# Patient Record
Sex: Male | Born: 1965
Health system: Southern US, Community
[De-identification: ages and names within clinical notes are randomized; demographics above are authoritative.]

## PROBLEM LIST (undated history)

## (undated) DIAGNOSIS — E119 Type 2 diabetes mellitus without complications: Secondary | ICD-10-CM

## (undated) DIAGNOSIS — J189 Pneumonia, unspecified organism: Secondary | ICD-10-CM

## (undated) DIAGNOSIS — I219 Acute myocardial infarction, unspecified: Secondary | ICD-10-CM

## (undated) DIAGNOSIS — E785 Hyperlipidemia, unspecified: Secondary | ICD-10-CM

## (undated) DIAGNOSIS — I2510 Atherosclerotic heart disease of native coronary artery without angina pectoris: Secondary | ICD-10-CM

## (undated) DIAGNOSIS — I10 Essential (primary) hypertension: Secondary | ICD-10-CM

## (undated) HISTORY — PX: FRACTURE SURGERY: SHX138

## (undated) HISTORY — DX: Atherosclerotic heart disease of native coronary artery without angina pectoris: I25.10

## (undated) HISTORY — DX: Essential (primary) hypertension: I10

## (undated) HISTORY — DX: Type 2 diabetes mellitus without complications: E11.9

## (undated) HISTORY — PX: CORONARY ANGIOPLASTY WITH STENT PLACEMENT: SHX49

## (undated) HISTORY — PX: APPENDECTOMY: SHX54

## (undated) HISTORY — PX: FINGER FRACTURE SURGERY: SHX638

## (undated) HISTORY — DX: Hyperlipidemia, unspecified: E78.5

---

## 1995-01-05 HISTORY — PX: WRIST FRACTURE SURGERY: SHX121

## 1995-01-05 HISTORY — PX: FOREARM FRACTURE SURGERY: SHX649

## 2006-01-05 ENCOUNTER — Ambulatory Visit: Payer: Self-pay | Admitting: Family Medicine

## 2007-05-17 ENCOUNTER — Encounter (INDEPENDENT_AMBULATORY_CARE_PROVIDER_SITE_OTHER): Payer: Self-pay | Admitting: *Deleted

## 2007-05-17 ENCOUNTER — Encounter: Admission: RE | Admit: 2007-05-17 | Discharge: 2007-05-17 | Payer: Self-pay | Admitting: Gastroenterology

## 2007-05-17 ENCOUNTER — Inpatient Hospital Stay (HOSPITAL_COMMUNITY): Admission: EM | Admit: 2007-05-17 | Discharge: 2007-05-20 | Payer: Self-pay | Admitting: Emergency Medicine

## 2008-12-29 ENCOUNTER — Ambulatory Visit: Payer: Self-pay | Admitting: Cardiology

## 2008-12-29 ENCOUNTER — Inpatient Hospital Stay (HOSPITAL_COMMUNITY): Admission: EM | Admit: 2008-12-29 | Discharge: 2009-01-01 | Payer: Self-pay | Admitting: Emergency Medicine

## 2008-12-29 DIAGNOSIS — I219 Acute myocardial infarction, unspecified: Secondary | ICD-10-CM

## 2008-12-29 HISTORY — DX: Acute myocardial infarction, unspecified: I21.9

## 2008-12-30 ENCOUNTER — Encounter: Payer: Self-pay | Admitting: Cardiology

## 2009-01-01 ENCOUNTER — Telehealth (INDEPENDENT_AMBULATORY_CARE_PROVIDER_SITE_OTHER): Payer: Self-pay | Admitting: *Deleted

## 2009-01-04 HISTORY — PX: CARDIAC CATHETERIZATION: SHX172

## 2009-01-10 ENCOUNTER — Encounter: Payer: Self-pay | Admitting: Cardiology

## 2009-01-22 DIAGNOSIS — I25118 Atherosclerotic heart disease of native coronary artery with other forms of angina pectoris: Secondary | ICD-10-CM | POA: Insufficient documentation

## 2009-01-22 DIAGNOSIS — E785 Hyperlipidemia, unspecified: Secondary | ICD-10-CM | POA: Insufficient documentation

## 2009-01-22 DIAGNOSIS — I2510 Atherosclerotic heart disease of native coronary artery without angina pectoris: Secondary | ICD-10-CM | POA: Insufficient documentation

## 2009-01-22 DIAGNOSIS — I498 Other specified cardiac arrhythmias: Secondary | ICD-10-CM | POA: Insufficient documentation

## 2009-01-22 DIAGNOSIS — I959 Hypotension, unspecified: Secondary | ICD-10-CM | POA: Insufficient documentation

## 2009-01-23 ENCOUNTER — Encounter: Payer: Self-pay | Admitting: Cardiology

## 2009-01-23 DIAGNOSIS — F17200 Nicotine dependence, unspecified, uncomplicated: Secondary | ICD-10-CM | POA: Insufficient documentation

## 2009-01-24 ENCOUNTER — Ambulatory Visit: Payer: Self-pay | Admitting: Cardiology

## 2009-04-30 ENCOUNTER — Ambulatory Visit: Payer: Self-pay | Admitting: Cardiology

## 2009-05-09 ENCOUNTER — Ambulatory Visit: Payer: Self-pay | Admitting: Cardiology

## 2009-05-23 ENCOUNTER — Telehealth: Payer: Self-pay | Admitting: Cardiology

## 2009-05-23 LAB — CONVERTED CEMR LAB
ALT: 54 units/L — ABNORMAL HIGH (ref 0–53)
AST: 35 units/L (ref 0–37)
Albumin: 3.9 g/dL (ref 3.5–5.2)
Alkaline Phosphatase: 85 units/L (ref 39–117)
Bilirubin, Direct: 0.1 mg/dL (ref 0.0–0.3)
Cholesterol: 140 mg/dL (ref 0–200)
HDL: 32.9 mg/dL — ABNORMAL LOW (ref >39.00)
LDL Cholesterol: 68 mg/dL (ref 0–99)
Total Bilirubin: 0.5 mg/dL (ref 0.3–1.2)
Total CHOL/HDL Ratio: 4
Total Protein: 7 g/dL (ref 6.0–8.3)
Triglycerides: 195 mg/dL — ABNORMAL HIGH (ref 0.0–149.0)
VLDL: 39 mg/dL (ref 0.0–40.0)

## 2009-07-11 ENCOUNTER — Emergency Department (HOSPITAL_COMMUNITY): Admission: EM | Admit: 2009-07-11 | Discharge: 2009-07-12 | Payer: Self-pay | Admitting: Emergency Medicine

## 2009-07-21 ENCOUNTER — Encounter: Payer: Self-pay | Admitting: Cardiology

## 2009-07-29 ENCOUNTER — Ambulatory Visit: Payer: Self-pay | Admitting: Cardiology

## 2009-08-26 ENCOUNTER — Telehealth: Payer: Self-pay | Admitting: Cardiology

## 2009-08-29 LAB — CONVERTED CEMR LAB
ALT: 53 units/L (ref 0–53)
AST: 37 units/L (ref 0–37)
Albumin: 3.9 g/dL (ref 3.5–5.2)
Alkaline Phosphatase: 83 units/L (ref 39–117)
Bilirubin, Direct: 0.1 mg/dL (ref 0.0–0.3)
Cholesterol: 119 mg/dL (ref 0–200)
Direct LDL: 66.7 mg/dL
HDL: 27.1 mg/dL — ABNORMAL LOW (ref >39.00)
Total Bilirubin: 0.8 mg/dL (ref 0.3–1.2)
Total CHOL/HDL Ratio: 4
Total Protein: 6.8 g/dL (ref 6.0–8.3)
Triglycerides: 211 mg/dL — ABNORMAL HIGH (ref 0.0–149.0)
VLDL: 42.2 mg/dL — ABNORMAL HIGH (ref 0.0–40.0)

## 2009-10-15 ENCOUNTER — Ambulatory Visit: Payer: Self-pay | Admitting: Cardiology

## 2009-10-21 LAB — CONVERTED CEMR LAB
BUN: 15 mg/dL (ref 6–23)
Basophils Absolute: 0.1 10*3/uL (ref 0.0–0.1)
Basophils Relative: 1 % (ref 0.0–3.0)
CO2: 30 meq/L (ref 19–32)
Calcium: 9.1 mg/dL (ref 8.4–10.5)
Chloride: 107 meq/L (ref 96–112)
Cholesterol: 193 mg/dL (ref 0–200)
Creatinine, Ser: 0.8 mg/dL (ref 0.4–1.5)
Eosinophils Absolute: 0.3 10*3/uL (ref 0.0–0.7)
Eosinophils Relative: 5.5 % — ABNORMAL HIGH (ref 0.0–5.0)
GFR calc non Af Amer: 106.27 mL/min (ref >60)
Glucose, Bld: 121 mg/dL — ABNORMAL HIGH (ref 70–99)
HCT: 42.7 % (ref 39.0–52.0)
HDL: 30.6 mg/dL — ABNORMAL LOW (ref >39.00)
Hemoglobin: 14.5 g/dL (ref 13.0–17.0)
LDL Cholesterol: 124 mg/dL — ABNORMAL HIGH (ref 0–99)
Lymphocytes Relative: 35.6 % (ref 12.0–46.0)
Lymphs Abs: 1.8 10*3/uL (ref 0.7–4.0)
MCHC: 34 g/dL (ref 30.0–36.0)
MCV: 87.8 fL (ref 78.0–100.0)
Monocytes Absolute: 0.4 10*3/uL (ref 0.1–1.0)
Monocytes Relative: 7 % (ref 3.0–12.0)
Neutro Abs: 2.6 10*3/uL (ref 1.4–7.7)
Neutrophils Relative %: 50.9 % (ref 43.0–77.0)
Platelets: 244 10*3/uL (ref 150.0–400.0)
Potassium: 4.3 meq/L (ref 3.5–5.1)
RBC: 4.86 M/uL (ref 4.22–5.81)
RDW: 14 % (ref 11.5–14.6)
Sodium: 140 meq/L (ref 135–145)
Total CHOL/HDL Ratio: 6
Triglycerides: 191 mg/dL — ABNORMAL HIGH (ref 0.0–149.0)
VLDL: 38.2 mg/dL (ref 0.0–40.0)
WBC: 5.1 10*3/uL (ref 4.5–10.5)

## 2009-12-25 ENCOUNTER — Telehealth: Payer: Self-pay | Admitting: Cardiology

## 2010-01-08 ENCOUNTER — Observation Stay (HOSPITAL_COMMUNITY)
Admission: EM | Admit: 2010-01-08 | Discharge: 2010-01-09 | Payer: Self-pay | Source: Home / Self Care | Attending: Cardiology | Admitting: Cardiology

## 2010-01-08 ENCOUNTER — Telehealth: Payer: Self-pay | Admitting: Cardiology

## 2010-01-08 LAB — POCT I-STAT, CHEM 8
BUN: 16 mg/dL (ref 6–23)
Calcium, Ion: 1.13 mmol/L (ref 1.12–1.32)
Chloride: 106 mEq/L (ref 96–112)
Creatinine, Ser: 0.9 mg/dL (ref 0.4–1.5)
Glucose, Bld: 110 mg/dL — ABNORMAL HIGH (ref 70–99)
HCT: 44 % (ref 39.0–52.0)
Hemoglobin: 15 g/dL (ref 13.0–17.0)
Potassium: 3.8 mEq/L (ref 3.5–5.1)
Sodium: 141 mEq/L (ref 135–145)
TCO2: 28 mmol/L (ref 0–100)

## 2010-01-08 LAB — COMPREHENSIVE METABOLIC PANEL
ALT: 37 U/L (ref 0–53)
AST: 25 U/L (ref 0–37)
Albumin: 4.1 g/dL (ref 3.5–5.2)
Alkaline Phosphatase: 83 U/L (ref 39–117)
BUN: 12 mg/dL (ref 6–23)
CO2: 25 mEq/L (ref 19–32)
Calcium: 9.3 mg/dL (ref 8.4–10.5)
Chloride: 107 mEq/L (ref 96–112)
Creatinine, Ser: 0.88 mg/dL (ref 0.4–1.5)
GFR calc Af Amer: >60 mL/min (ref >60)
GFR calc non Af Amer: >60 mL/min (ref >60)
Glucose, Bld: 104 mg/dL — ABNORMAL HIGH (ref 70–99)
Potassium: 3.7 mEq/L (ref 3.5–5.1)
Sodium: 141 mEq/L (ref 135–145)
Total Bilirubin: 0.5 mg/dL (ref 0.3–1.2)
Total Protein: 7.5 g/dL (ref 6.0–8.3)

## 2010-01-08 LAB — POCT CARDIAC MARKERS
CKMB, poc: 1.4 ng/mL (ref 1.0–8.0)
Myoglobin, poc: 70.2 ng/mL (ref 12–200)
Troponin i, poc: <0.05 ng/mL (ref 0.00–0.09)

## 2010-01-08 LAB — DIFFERENTIAL
Basophils Absolute: 0.1 10*3/uL (ref 0.0–0.1)
Basophils Relative: 1 % (ref 0–1)
Eosinophils Absolute: 0.5 10*3/uL (ref 0.0–0.7)
Eosinophils Relative: 7 % — ABNORMAL HIGH (ref 0–5)
Lymphocytes Relative: 38 % (ref 12–46)
Lymphs Abs: 2.4 10*3/uL (ref 0.7–4.0)
Monocytes Absolute: 0.4 10*3/uL (ref 0.1–1.0)
Monocytes Relative: 6 % (ref 3–12)
Neutro Abs: 3.1 10*3/uL (ref 1.7–7.7)
Neutrophils Relative %: 49 % (ref 43–77)

## 2010-01-08 LAB — CBC
HCT: 43.4 % (ref 39.0–52.0)
Hemoglobin: 14.5 g/dL (ref 13.0–17.0)
MCH: 29.2 pg (ref 26.0–34.0)
MCHC: 33.4 g/dL (ref 30.0–36.0)
MCV: 87.3 fL (ref 78.0–100.0)
Platelets: 225 10*3/uL (ref 150–400)
RBC: 4.97 MIL/uL (ref 4.22–5.81)
RDW: 14.8 % (ref 11.5–15.5)
WBC: 6.3 10*3/uL (ref 4.0–10.5)

## 2010-01-08 LAB — PROTIME-INR
INR: 0.9 (ref 0.00–1.49)
Prothrombin Time: 12.4 seconds (ref 11.6–15.2)

## 2010-01-08 LAB — CK TOTAL AND CKMB (NOT AT ARMC)
CK, MB: 2.2 ng/mL (ref 0.3–4.0)
Relative Index: INVALID (ref 0.0–2.5)
Total CK: 88 U/L (ref 7–232)

## 2010-01-08 LAB — TROPONIN I: Troponin I: 0.02 ng/mL (ref 0.00–0.06)

## 2010-01-08 LAB — HEMOGLOBIN A1C
Hgb A1c MFr Bld: 6.2 % — ABNORMAL HIGH (ref <5.7)
Mean Plasma Glucose: 131 mg/dL — ABNORMAL HIGH (ref <117)

## 2010-01-08 LAB — CARDIAC PANEL(CRET KIN+CKTOT+MB+TROPI)
CK, MB: 1.9 ng/mL (ref 0.3–4.0)
Relative Index: INVALID (ref 0.0–2.5)
Total CK: 73 U/L (ref 7–232)
Troponin I: 0.02 ng/mL (ref 0.00–0.06)

## 2010-01-08 LAB — MAGNESIUM: Magnesium: 2.1 mg/dL (ref 1.5–2.5)

## 2010-01-08 LAB — APTT: aPTT: 29 seconds (ref 24–37)

## 2010-01-09 LAB — CBC
HCT: 43.2 % (ref 39.0–52.0)
Hemoglobin: 14.3 g/dL (ref 13.0–17.0)
MCH: 29.1 pg (ref 26.0–34.0)
MCHC: 33.1 g/dL (ref 30.0–36.0)
MCV: 88 fL (ref 78.0–100.0)
Platelets: 220 10*3/uL (ref 150–400)
RBC: 4.91 MIL/uL (ref 4.22–5.81)
RDW: 14.9 % (ref 11.5–15.5)
WBC: 6.5 10*3/uL (ref 4.0–10.5)

## 2010-01-09 LAB — LIPID PANEL
Cholesterol: 172 mg/dL (ref 0–200)
HDL: 28 mg/dL — ABNORMAL LOW (ref >39)
LDL Cholesterol: 105 mg/dL — ABNORMAL HIGH (ref 0–99)
Total CHOL/HDL Ratio: 6.1 RATIO
Triglycerides: 196 mg/dL — ABNORMAL HIGH (ref <150)
VLDL: 39 mg/dL (ref 0–40)

## 2010-01-09 LAB — CARDIAC PANEL(CRET KIN+CKTOT+MB+TROPI)
CK, MB: 1.7 ng/mL (ref 0.3–4.0)
Relative Index: INVALID (ref 0.0–2.5)
Total CK: 77 U/L (ref 7–232)
Troponin I: 0.02 ng/mL (ref 0.00–0.06)

## 2010-01-09 LAB — BASIC METABOLIC PANEL
BUN: 12 mg/dL (ref 6–23)
CO2: 24 mEq/L (ref 19–32)
Calcium: 9.3 mg/dL (ref 8.4–10.5)
Chloride: 108 mEq/L (ref 96–112)
Creatinine, Ser: 0.89 mg/dL (ref 0.4–1.5)
GFR calc Af Amer: >60 mL/min (ref >60)
GFR calc non Af Amer: >60 mL/min (ref >60)
Glucose, Bld: 99 mg/dL (ref 70–99)
Potassium: 4 mEq/L (ref 3.5–5.1)
Sodium: 143 mEq/L (ref 135–145)

## 2010-01-09 LAB — HEPARIN LEVEL (UNFRACTIONATED)
Heparin Unfractionated: <0.1 IU/mL — ABNORMAL LOW (ref 0.30–0.70)
Heparin Unfractionated: <0.1 IU/mL — ABNORMAL LOW (ref 0.30–0.70)

## 2010-01-19 LAB — HEPARIN LEVEL (UNFRACTIONATED): Heparin Unfractionated: 0.19 IU/mL — ABNORMAL LOW (ref 0.30–0.70)

## 2010-01-19 LAB — D-DIMER, QUANTITATIVE: D-Dimer, Quant: 0.29 ug/mL-FEU (ref 0.00–0.48)

## 2010-01-23 ENCOUNTER — Encounter: Payer: Self-pay | Admitting: Physician Assistant

## 2010-01-23 NOTE — H&P (Signed)
NAME:  Dennis Terry, Dennis Terry NO.:  [id number]  MEDICAL RECORD NO.:  [id number]          PATIENT TYPE:  INP  LOCATION:  2032                         FACILITY:  [facility]  PHYSICIAN:  Khyrie Masi. Elidia Bonenfant, MD, FACCDATE OF BIRTH:  [birth year]  DATE OF ADMISSION:  01/08/2010 DATE OF DISCHARGE:                             HISTORY & PHYSICAL   PRIMARY CARE PHYSICIAN:  Delaney Meigs, MD  PRIMARY CARDIOLOGIST:  Luis Abed, MD, [facility]  CHIEF COMPLAINT:  Chest pain.  HISTORY OF PRESENT ILLNESS:  Mr. Salemi is a 45 year old male with a history of coronary artery disease.  He has noted a several-month history of episodic left-sided chest pain.  It is not exertional.  He describes it as both aching and throbbing.  There is no radiation and no associated symptoms.  It is not positional and does not change with deep inspiration.  There has been a crescendo pattern.  The intensity of the symptoms has increased to 5/10.  The frequency has increased to the point that he is having 3-4 episodes a day.  He came to the emergency today because of the increased frequency.  It reminds him of his MI symptoms except that chest pain was substernal and this is left sided, but the patient and his family and friends believe that his chest pain is cardiac in origin.  PAST MEDICAL HISTORY: 1. Status post non-ST-segment elevation MI in December 2010 with     recurrent chest pain, cardiac catheterization showing RCA totalled     and treated with a 3.0 x 18 mm Xience stent, LAD and circumflex     showing luminal irregularities. 2. Preserved left ventricular function with an EF of 50-55% at cath. 3. Hyperlipidemia, HDL 31 and LDL 124 in October 2011. 4. Family history of coronary artery disease. 5. Remote history of tobacco use.  PAST SURGICAL HISTORY:  He is status post open appendectomy, cardiac catheterization, and left arm surgery.  ALLERGIES:  He had hypotension with NITROGLYCERIN SUBLINGUAL,  he is allergic to [facility] and did not tolerate CRESTOR with various side effects.  CURRENT MEDICATIONS: 1. Aspirin 325 mg daily. 2. Effient 10 mg daily. 3. Livalo, dose unclear. 4. Red yeast rice 2 tablets daily. 5. Fish oil, the patient states, 1400 mg daily for 5 days.  SOCIAL HISTORY:  He lives in [place] alone.  His girlfriend and family members live nearby.  He works in [employer], a Clinical cytogeneticist.  He quit tobacco in December 2010 with approximately 30-pack-year history and denies alcohol or drug abuse.  FAMILY HISTORY:  His mother died at 9 with pancreatic cancer, no heart disease.  His father died of 17 with an MI but no siblings have coronary artery disease.  REVIEW OF SYSTEMS:  He has occasional arthralgias and joint pains. Chest pain is described above.  He has not had any recent illnesses, fevers, or chills and does not regularly experience reflux symptoms.  He has not had melena.  Full 14-point review of systems is, otherwise, negative except as stated in the HPI.  PHYSICAL EXAMINATION:  VITAL SIGNS:  Temperature is 98.0, blood pressure  135/65, pulse 86, respiratory rate 20, O2 saturation 97% on room air. GENERAL:  He is a well-developed, well-nourished, white male in no acute distress. HEENT:  Normal. NECK:  There is no lymphadenopathy, thyromegaly, bruit, or JVD noted. CARDIOVASCULAR:  His heart is regular rate and rhythm with an S1 and S2 and no significant murmur, rub, or gallop is noted.  Distal pulses are intact in all four extremities. LUNGS:  Clear to auscultation bilaterally. SKIN:  No rashes or lesions are noted. ABDOMEN:  Soft and nontender with active bowel sounds. EXTREMITIES:  There is no cyanosis, clubbing, or edema noted. MUSCULOSKELETAL:  There is no joint deformity or effusions.  No spine or CVA tenderness. NEUROLOGIC:  He is alert and oriented.  Cranial nerves II through XII grossly intact.  Chest x-ray shows no acute process, chronic bronchitic  markings. EKG showed sinus rhythm, rate 76, with some minor changes including slightly different axis, but there are no acute ischemic changes.  LABORATORY VALUES:  Hemoglobin 14.5, hematocrit 43.4, WBCs 6.3, platelets 225.  Sodium 141, potassium 3.8, chloride 106, BUN 16, creatinine 0.9, glucose 110.  Point-of-care markers negative x1.  IMPRESSION:  Mr. Mccravy was seen today by Dr. Daleen Squibb, the patient was evaluated and the data reviewed.  He is a difficult historian.  His chest pain has some features of angina but is mostly atypical.  Because of his history of significant coronary artery disease, diagnostic cath is the best option.  The options, including the risks and benefits, were discussed with the patient and his family, and they agreed to proceed. It may be done late tomorrow as the schedule is very heavy, and the patient and his family were made aware of this.  We will screen for cardiac risk factors and continue his home medications.     Theodore Demark, PA-C   ______________________________ Jesse Sans Daleen Squibb, MD, [facility]    RB/MEDQ  D:  01/08/2010  T:  01/09/2010  Job:  [id number]  Electronically Signed by Theodore Demark PA-C on 01/23/2010 06:47:33 AM Electronically Signed by Valera Castle MD [facility] on 01/23/2010 04:16:06 PM

## 2010-01-26 ENCOUNTER — Ambulatory Visit
Admission: RE | Admit: 2010-01-26 | Discharge: 2010-01-26 | Payer: Self-pay | Source: Home / Self Care | Attending: Physician Assistant | Admitting: Physician Assistant

## 2010-01-29 NOTE — Discharge Summary (Addendum)
NAME:  Dennis Terry, Dennis Terry                ACCOUNT NO.:  [id number]  MEDICAL RECORD NO.:  [id number]           PATIENT TYPE:  LOCATION:                                 FACILITY:  PHYSICIAN:  Bevelyn Buckles. Bensimhon, MDDATE OF BIRTH:  January 29, 1965  DATE OF ADMISSION: DATE OF DISCHARGE:                              DISCHARGE SUMMARY   DATE OF ADMISSION:  January 08, 2010.  DATE OF DISCHARGE:  January 09, 2010.  PRIMARY CARDIOLOGIST:  Luis Abed, MD, [facility].  PRIMARY CARE Charlise Giovanetti:  Delaney Meigs, M.D.  DISCHARGE DIAGNOSIS:  Chest pain without objective evidence of ischemia.  SECONDARY DIAGNOSES: 1. Coronary artery disease status post prior non-ST-segment elevation     myocardial infarction, December 2010, with drug-eluting stent     placement of the right coronary artery. 2. Hyperlipidemia. 3. Remote tobacco abuse.  ALLERGIES:  PERCOCET and he did not tolerate Crestor.  He has a history of hypotension with sublingual nitroglycerin.  PROCEDURES:  Left heart cardiac catheterization performed on January 09, 2010, showing 30% in-stent restenosis within the previously placed proximal RCA stent and overall nonobstructive disease with an EF of 65%.  HISTORY OF PRESENT ILLNESS:  This is a 45 year old male with prior history of CAD status post non-ST-elevation MI in December 2010 with drug-eluting stent placement of the proximal RCA.  The patient was in his usual state of health until several months ago when he began to experience episodic left-sided chest discomfort generally and occurring at rest without associated symptoms, occurring as much as 3 or 4 times a day.  Due to increasing frequency of symptoms, patient presented to [facility] ED on January 5 where ECG showed no acute changes, and enzymes were initially negative.  He was admitted for further evaluation.  HOSPITAL COURSE:  The patient ruled out for MI.  Decision was made to pursue cardiac catheterization given progression  of symptoms. Diagnostic catheterization earlier today showed nonobstructive disease with relative patency of his previously placed RCA stent.  EF was normal at 65%.  Followup D-dimer was also negative.  We have initiated PPI therapy, and patient will be discharged home this evening in good condition.  DISCHARGE LABS:  Hemoglobin 14.3, hematocrit 43.2, WBCs 6.5, and platelets 220.  Sodium 143, potassium 4.0, chloride 108, CO2 of 24, BUN 12, creatinine 0.89, glucose 99, total bilirubin 0.5, alkaline phosphatase 83, AST 25, ALT 37, total protein 7.5, albumin 4.1, calcium 9.2, and magnesium 2.1.  Hemoglobin A1c 6.2.  CK 77, MB 1.7, troponin-I 0.02.  Total cholesterol 172, triglycerides 196, HDL 28, and LDL 105.  DISPOSITION:  The patient will be discharged home today in good condition.  FOLLOWUP APPOINTMENTS: 1. We will arrange for followup with Dr. Myrtis Ser in approximately 2     weeks. 2. Follow up with Dr. Lysbeth Galas as scheduled.  DISCHARGE MEDICATIONS: 1. Protonix 40 mg daily. 2. Aspirin 325 mg daily. 3. Effient 10 mg daily. 4. Fish oil 1 capsule daily. 5. Livalo 4 mg half a tablet daily. 6. Nitroglycerin 0.4 mg sublingual p.r.n. chest pain. 7. Red yeast rice 2 tablets at bedtime.  OUTSTANDING LABORATORY  STUDIES:  None.  Duration of discharge encounter, 35 minutes including physician time.     Nicolasa Ducking, ANP   ______________________________ Bevelyn Buckles. Bensimhon, MD    CB/MEDQ  D:  01/09/2010  T:  01/10/2010  Job:  [id number]  cc:   Delaney Meigs, M.D.  Electronically Signed by Nicolasa Ducking ANP on 01/26/2010 03:46:20 PM Electronically Signed by Arvilla Meres MD on 01/29/2010 04:02:50 PM

## 2010-02-03 NOTE — Letter (Signed)
Summary: Lipid reminder  [facility], Main Office  [address]   [place], [place] [address]   Phone: [phone]  Fax: [phone]        July 21, 2009 MRN: [id number]    Dennis Terry [address] [place], [place]  [address]    Dear Dennis Terry,  Our records indicate it is time to check your cholesterol, due to your recent medication change.  Please call our office to schedule an appt for labwork.  Please remember it is a fasting lab.        Sincerely,    Meredith Staggers, RN Willa Rough, MD  This letter has been electronically signed by your physician.

## 2010-02-03 NOTE — Assessment & Plan Note (Signed)
Summary: 6 month rov/sl   Visit Type:  Follow-up Primary Provider:  Vernon Prey, MD  CC:  CAD.  History of Present Illness: The patient is seen for followup of coronary artery disease.  He is doing very well.  I saw him last April, 2011.  He had an occluded right coronary artery that was treated with drug-eluting stent December 29, 2008.  He stopped smoking the day and has not restarted.  He is not having any chest pain.  He is very active.  We have made some adjustment in his cholesterol meds.  His LDL is nicely controlled.  HDL is low and triglycerides elevated and 500 mg of slow niacin was started.  He will have followup labs today.  Current Medications (verified): 1)  Aspirin Ec 325 Mg Tbec (Aspirin) .... Take One Tablet By Mouth Daily 2)  Crestor 20 Mg Tabs (Rosuvastatin Calcium) .... Take One Tablet By Mouth Daily. 3)  Effient 10 Mg Tabs (Prasugrel Hcl) .... Take One Tablet By Mouth Daily 4)  Nitrostat 0.4 Mg Subl (Nitroglycerin) .Marland Kitchen.. 1 Tablet Under Tongue At Onset of Chest Pain; You May Repeat Every 5 Minutes For Up To 3 Doses. 5)  Slo-Niacin 500 Mg Cr-Tabs (Niacin) .... Once Daily At Bedtime  Allergies (verified): 1)  ! Percocet  Past History:  Past Medical History: BRADYCARDIA (ICD-427.89) HYPOTENSION, UNSPECIFIED (ICD-458.9) HYPERLIPIDEMIA-MIXED (ICD-272.4) Acute coronary syndrome...12/29/2008 CAD, NATIVE VESSEL (ICD-414.01)...cath...12/29/2008...occluded RCA...successful DES.Marland KitchenMarland KitchenNahser EF 55%...echo...12/30/2008...mod/severe hypo  base/mid inferior wall Smoking Hx...  Stopped... December, 2010    Review of Systems       Patient denied fever, chills, headache, sweats, rash, change in vision, change in hearing, chest pain, cough, nausea vomiting, urinary symptoms.  All other systems are reviewed and are negative.  Vital Signs:  Patient profile:   45 year old male Height:      69 inches Weight:      225 pounds BMI:     33.35 Pulse rate:   84 / minute BP sitting:   122  / 86  (left arm) Cuff size:   regular  Vitals Entered By: Hardin Negus, RMA (October 15, 2009 9:40 AM)  Physical Exam  General:  patient looks great. Eyes:  no xanthelasma. Neck:  no jugular venous distention. Lungs:  lungs are clear cardiorespiratory effort is not labored. Heart:  cardiac exam reveals S1-S2.  No clicks or significant murmurs. Abdomen:  abdomen is soft. Extremities:  no peripheral edema. Psych:  patient is oriented to person time and place.  Affect is normal.   Impression & Recommendations:  Problem # 1:  TOBACCO ABUSE (ICD-305.1) The patient is no longer smoking.  Problem # 2:  HYPOTENSION, UNSPECIFIED (ICD-458.9) Blood pressure is stable.  No change in therapy.  Problem # 3:  CAD, NATIVE VESSEL (ICD-414.01)  His updated medication list for this problem includes:    Aspirin Ec 325 Mg Tbec (Aspirin) .Marland Kitchen... Take one tablet by mouth daily    Effient 10 Mg Tabs (Prasugrel hcl) .Marland Kitchen... Take one tablet by mouth daily    Nitrostat 0.4 Mg Subl (Nitroglycerin) .Marland Kitchen... 1 tablet under tongue at onset of chest pain; you may repeat every 5 minutes for up to 3 doses. Coronary disease is stable.  No change in therapy.  Orders: TLB-BMP (Basic Metabolic Panel-BMET) (80048-METABOL) TLB-CBC Platelet - w/Differential (85025-CBCD) TLB-Lipid Panel (80061-LIPID)  Problem # 4:  HYPERLIPIDEMIA-MIXED (ICD-272.4)  His updated medication list for this problem includes:    Crestor 20 Mg Tabs (Rosuvastatin calcium) .Marland Kitchen... Take one  tablet by mouth daily.    Slo-niacin 500 Mg Cr-tabs (Niacin) ..... Once daily at bedtime Fasting lipids will be done today as he has not eaten since last night.  We will then decide about the next dose of his slow niacin.  I see him back in 6 months for cardiology followup.  Orders: TLB-BMP (Basic Metabolic Panel-BMET) (80048-METABOL) TLB-CBC Platelet - w/Differential (85025-CBCD) TLB-Lipid Panel (80061-LIPID)  Patient Instructions: 1)  Labs today 2)   Your physician wants you to follow-up in:  6 months.  You will receive a reminder letter in the mail two months in advance. If you don't receive a letter, please call our office to schedule the follow-up appointment.

## 2010-02-03 NOTE — Letter (Signed)
Summary: Return To Work  [employer], Main Office  [address]   [place], [place] [address]   Phone: [phone]  Fax: [phone]    01/10/2009  TO: WHOM IT MAY CONCERN   RE: Dennis Terry [address] [id number]   The above named individual is under my medical care and is to remain out of work until seen for follow up appointment on Friday January 24, 2009.  If you have any further questions or need additional information, please call.     Sincerely,    Meredith Staggers, RN Willa Rough, MD

## 2010-02-03 NOTE — Letter (Signed)
Summary: Return To Work  [employer], Main Office  [address]   [place], [place] [address]   Phone: [phone]  Fax: [phone]    01/24/2009  TO: Leodis Sias IT MAY CONCERN   RE: Dennis Terry [address] [id number]   The above named individual is under my medical care and may return to work on: Sunday January 26, 2009  If you have any further questions or need additional information, please call.     Sincerely,    Zackery Barefoot, MD

## 2010-02-03 NOTE — Progress Notes (Signed)
  Walk in Patient Form Recieved "Patient left FMLA papers forwarded to [facility] for processing. Cala Bradford Mesiemore  January 01, 2009 2:49 PM    Appended Document:  Recieved Request for Records from [employer] forwarded to [employer] for processing.   Appended Document:  Recieved a 2nd Request for Records from [employer] forwarded to [facility]

## 2010-02-03 NOTE — Miscellaneous (Signed)
  Clinical Lists Changes  Problems: Added new problem of TOBACCO ABUSE (ICD-305.1) Observations: Added new observation of PAST MED HX: BRADYCARDIA (ICD-427.89) HYPOTENSION, UNSPECIFIED (ICD-458.9) HYPERLIPIDEMIA-MIXED (ICD-272.4) Acute coronary syndrome...12/29/2008 CAD, NATIVE VESSEL (ICD-414.01)...cath...12/29/2009...occluded RCA...successful DES.Marland KitchenMarland KitchenNahser EF 55%...echo...12/30/2008...mod/severe hypo  base/mid inferior wall Smoking Hx   (01/23/2009 16:20)       Past History:  Past Medical History: BRADYCARDIA (ICD-427.89) HYPOTENSION, UNSPECIFIED (ICD-458.9) HYPERLIPIDEMIA-MIXED (ICD-272.4) Acute coronary syndrome...12/29/2008 CAD, NATIVE VESSEL (ICD-414.01)...cath...12/29/2009...occluded RCA...successful DES.Marland KitchenMarland KitchenNahser EF 55%...echo...12/30/2008...mod/severe hypo  base/mid inferior wall Smoking Hx

## 2010-02-03 NOTE — Progress Notes (Signed)
Summary: test results  Phone Note Call from Patient Call back at Home Phone [phone]   Caller: Patient Reason for Call: Talk to Nurse, Lab or Test Results Initial call taken by: Lorne Skeens,  August 26, 2009 12:16 PM  Follow-up for Phone Call        unable to reach pt  Meredith Staggers, RN  August 27, 2009 6:13 PM  unable to reach pt, vm states "mailbox not set up" Meredith Staggers, RN  August 29, 2009 2:05 PM    Pt returning call Judie Grieve  August 29, 2009 4:00 PM  Additional Follow-up for Phone Call Additional follow up Details #1::        pt given lab results, he is aware that Dr Myrtis Ser will be speaking w/Sally and we will call him back w/recommendations Meredith Staggers, RN  August 29, 2009 4:14 PM      Appended Document: test results I have reviewed again and feel we should not change anythying.  Appended Document: test results pt aware

## 2010-02-03 NOTE — Progress Notes (Signed)
Summary: returning call  Phone Note Call from Patient Call back at Home Phone [phone]   Caller: Patient Reason for Call: Talk to Nurse Summary of Call: returning call Initial call taken by: Migdalia Dk,  May 23, 2009 8:16 AM  Follow-up for Phone Call        spoke w/pt regarding lab results Meredith Staggers, RN  May 23, 2009 11:48 AM

## 2010-02-03 NOTE — Assessment & Plan Note (Signed)
Summary: eph   Visit Type:  post hospital Primary Provider:  Vernon Prey, MD  CC:  CAD.  History of Present Illness: The patient is seen post hospitalization for coronary artery disease.  I have reviewed all the hospital records from this hospitalization December 29, 2008.  At that time he presented with unstable symptoms.  He had to go urgently into the catheter lab and his right coronary was occluded.  This was treated with a drug-eluting stent the right coronary artery.  He has done very well.  He is stop smoking and he is walking daily.  He's not had any return of chest pain.  He is taking all of his appropriate medications.  Current Medications (verified): 1)  Aspirin Ec 325 Mg Tbec (Aspirin) .... Take One Tablet By Mouth Daily 2)  Metoprolol Succinate 25 Mg Xr24h-Tab (Metoprolol Succinate) .... Take One Tablet By Mouth Daily 3)  Crestor 20 Mg Tabs (Rosuvastatin Calcium) .... Take One Tablet By Mouth Daily. 4)  Effient 10 Mg Tabs (Prasugrel Hcl) .... Take One Tablet By Mouth Daily 5)  Nitrostat 0.4 Mg Subl (Nitroglycerin) .Marland Kitchen.. 1 Tablet Under Tongue At Onset of Chest Pain; You May Repeat Every 5 Minutes For Up To 3 Doses.  Allergies (verified): 1)  ! Percocet  Past History:  Past Medical History: Last updated: 01/23/2009 BRADYCARDIA (ICD-427.89) HYPOTENSION, UNSPECIFIED (ICD-458.9) HYPERLIPIDEMIA-MIXED (ICD-272.4) Acute coronary syndrome...12/29/2008 CAD, NATIVE VESSEL (ICD-414.01)...cath...12/29/2009...occluded RCA...successful DES.Marland KitchenMarland KitchenNahser EF 55%...echo...12/30/2008...mod/severe hypo  base/mid inferior wall Smoking Hx    Review of Systems       Patient denies fever, chills, headache, sweats, rash, change in vision, change in hearing, chest pain, cough, nausea vomiting, shortness of breath, urinary symptoms, musculoskeletal problems.  All other systems are reviewed and are negative.  Vital Signs:  Patient profile:   45 year old male Height:      69 inches Weight:      212  pounds BMI:     31.42 Pulse rate:   74 / minute BP sitting:   154 / 82  (left arm) Cuff size:   regular  Vitals Entered By: Hardin Negus, RMA (January 24, 2009 9:19 AM)  Physical Exam  General:  he is quite stable in general. Head:  head is atraumatic. Eyes:  no xanthelasma. Neck:  no jugular venous distention. Chest Wall:  no chest wall tenderness. Lungs:  lungs are clear.  Respiratory effort is nonlabored. Heart:  cardiac exam reveals S1-S2.  No clicks or significant murmurs. Abdomen:  abdomen is soft. Msk:  no musculoskeletal deformities. Extremities:  no peripheral edema. Skin:  no skin rashes. Psych:  patient is oriented to person time and place.  Affect is normal.   Impression & Recommendations:  Problem # 1:  TOBACCO ABUSE (ICD-305.1) The patient has stopped smoking.  I praise him for this.  I also explained to him by showing him a model that by not smoking, the areas of atherosclerosis would remain more stable and not progress.  Problem # 2:  BRADYCARDIA (ICD-427.89)  His updated medication list for this problem includes:    Aspirin Ec 325 Mg Tbec (Aspirin) .Marland Kitchen... Take one tablet by mouth daily    Metoprolol Succinate 25 Mg Xr24h-tab (Metoprolol succinate) .Marland Kitchen... Take one tablet by mouth daily    Effient 10 Mg Tabs (Prasugrel hcl) .Marland Kitchen... Take one tablet by mouth daily    Nitrostat 0.4 Mg Subl (Nitroglycerin) .Marland Kitchen... 1 tablet under tongue at onset of chest pain; you may repeat every 5 minutes for up  to 3 doses.  he has not had any problems with ongoing bradycardia at this point.  Problem # 3:  HYPOTENSION, UNSPECIFIED (ICD-458.9) In the hospital he had some hypotension.  This is not a recurring problem.  His blood pressure will have to be watched carefully.  Problem # 4:  HYPERLIPIDEMIA-MIXED (ICD-272.4)  His updated medication list for this problem includes:    Crestor 20 Mg Tabs (Rosuvastatin calcium) .Marland Kitchen... Take one tablet by mouth daily.  I explained to the  patient importance of remaining on a statin.  His lipids will be followed up through his primary physician.  Problem # 5:  CAD, NATIVE VESSEL (ICD-414.01)  His updated medication list for this problem includes:    Aspirin Ec 325 Mg Tbec (Aspirin) .Marland Kitchen... Take one tablet by mouth daily    Metoprolol Succinate 25 Mg Xr24h-tab (Metoprolol succinate) .Marland Kitchen... Take one tablet by mouth daily    Effient 10 Mg Tabs (Prasugrel hcl) .Marland Kitchen... Take one tablet by mouth daily    Nitrostat 0.4 Mg Subl (Nitroglycerin) .Marland Kitchen... 1 tablet under tongue at onset of chest pain; you may repeat every 5 minutes for up to 3 doses.  Orders: EKG w/ Interpretation (93000)  Patient is not having any recurrent chest pain.  EKG is done today and reviewed by me.  It is normal.  He has had an excellent result from his urgent drug-eluting stent to the right coronary artery.  He will continue exercising and not smoking.  His blood pressure will have to be watched.  He will continue his medications.  He is stable and he can be allowed to return to work.  We'll see him back in 3 months.  I had a very complete discussion with the patient about all of his medications.  Patient Instructions: 1)  Your physician deems you medically cleared to return to work.  A return to work note was provided today. 2)  Follow up in 3 months

## 2010-02-03 NOTE — Assessment & Plan Note (Signed)
Summary: PER CHECK/SAF   Visit Type:  Follow-up Primary Provider:  Vernon Prey, MD  CC:  CAD.  History of Present Illness: The patient is seen for followup of coronary disease.  He had a totally occluded right coronary that was opened successfully with a drug-eluting stent on December 29, 2008.  He has done well since then.  He is had no return of chest pain.  He works actively.  He does sweat a lot and asked me about this.  This is an old history for him and it does not represent a sign of ischemia.  Current Medications (verified): 1)  Aspirin Ec 325 Mg Tbec (Aspirin) .... Take One Tablet By Mouth Daily 2)  Metoprolol Succinate 25 Mg Xr24h-Tab (Metoprolol Succinate) .... Take One Tablet By Mouth Daily 3)  Crestor 20 Mg Tabs (Rosuvastatin Calcium) .... Take One Tablet By Mouth Daily. 4)  Effient 10 Mg Tabs (Prasugrel Hcl) .... Take One Tablet By Mouth Daily 5)  Nitrostat 0.4 Mg Subl (Nitroglycerin) .Marland Kitchen.. 1 Tablet Under Tongue At Onset of Chest Pain; You May Repeat Every 5 Minutes For Up To 3 Doses.  Allergies: 1)  ! Percocet  Past History:  Past Medical History: BRADYCARDIA (ICD-427.89) HYPOTENSION, UNSPECIFIED (ICD-458.9) HYPERLIPIDEMIA-MIXED (ICD-272.4) Acute coronary syndrome...12/29/2008 CAD, NATIVE VESSEL (ICD-414.01)...cath...12/29/2008...occluded RCA...successful DES.Marland KitchenMarland KitchenNahser EF 55%...echo...12/30/2008...mod/severe hypo  base/mid inferior wall Smoking Hx...  Stopped... December, 2010    Review of Systems       Patient denies fever, chills, headache, sweats, rash, change in vision, change in hearing, chest pain, cough, nausea vomiting, urinary symptoms.  All of the systems are reviewed and are negative  Vital Signs:  Patient profile:   45 year old male Height:      69 inches Weight:      219 pounds BMI:     32.46 Pulse rate:   80 / minute Pulse rhythm:   regular BP sitting:   126 / 80  (left arm) Cuff size:   regular  Vitals Entered By: Stanton Kidney, EMT-P (April 30, 2009 8:36 AM)  Physical Exam  General:  patient is stable today. Eyes:  no xanthelasma. Neck:  no jugular venous distention. Lungs:  lungs are clear.  Respiratory effort is nonlabored. Heart:  cardiac exam reveals S1-S2.  There no clicks or significant murmurs. Abdomen:  abdomen is soft. Extremities:  no peripheral edema. Psych:  patient is oriented to time and place.  Affect is normal.   Impression & Recommendations:  Problem # 1:  TOBACCO ABUSE (ICD-305.1) The patient stopped smoking at the time of his MI in December, 2010.  He continues not to smoke  Problem # 2:  BRADYCARDIA (ICD-427.89)  The following medications were removed from the medication list:    Metoprolol Succinate 25 Mg Xr24h-tab (Metoprolol succinate) .Marland Kitchen... Take one tablet by mouth daily His updated medication list for this problem includes:    Aspirin Ec 325 Mg Tbec (Aspirin) .Marland Kitchen... Take one tablet by mouth daily    Effient 10 Mg Tabs (Prasugrel hcl) .Marland Kitchen... Take one tablet by mouth daily    Nitrostat 0.4 Mg Subl (Nitroglycerin) .Marland Kitchen... 1 tablet under tongue at onset of chest pain; you may repeat every 5 minutes for up to 3 doses. Patient has had mild bradycardia but this is not a significant issue.  The following medications were removed from the medication list:    Metoprolol Succinate 25 Mg Xr24h-tab (Metoprolol succinate) .Marland Kitchen... Take one tablet by mouth daily His updated medication list for this problem includes:  Aspirin Ec 325 Mg Tbec (Aspirin) .Marland Kitchen... Take one tablet by mouth daily    Effient 10 Mg Tabs (Prasugrel hcl) .Marland Kitchen... Take one tablet by mouth daily    Nitrostat 0.4 Mg Subl (Nitroglycerin) .Marland Kitchen... 1 tablet under tongue at onset of chest pain; you may repeat every 5 minutes for up to 3 doses.  Problem # 3:  HYPOTENSION, UNSPECIFIED (ICD-458.9) Bpressure is stable now.  He would like to reduce his medicines if possible.  I feel that the beta blocker is not absolutely indicated and he will stop his  metoprolol.  Problem # 4:  HYPERLIPIDEMIA-MIXED (ICD-272.4)  His updated medication list for this problem includes:    Crestor 20 Mg Tabs (Rosuvastatin calcium) .Marland Kitchen... Take one tablet by mouth daily. We're arranging for a fasting lipid profile.  His updated medication list for this problem includes:    Crestor 20 Mg Tabs (Rosuvastatin calcium) .Marland Kitchen... Take one tablet by mouth daily.  Problem # 5:  CAD, NATIVE VESSEL (ICD-414.01)  The following medications were removed from the medication list:    Metoprolol Succinate 25 Mg Xr24h-tab (Metoprolol succinate) .Marland Kitchen... Take one tablet by mouth daily His updated medication list for this problem includes:    Aspirin Ec 325 Mg Tbec (Aspirin) .Marland Kitchen... Take one tablet by mouth daily    Effient 10 Mg Tabs (Prasugrel hcl) .Marland Kitchen... Take one tablet by mouth daily    Nitrostat 0.4 Mg Subl (Nitroglycerin) .Marland Kitchen... 1 tablet under tongue at onset of chest pain; you may repeat every 5 minutes for up to 3 doses. The patient's cardiac status is stable.  He is on aspirin and prasugrel.  He will continue this for at least one year.  I see him back in 6 months for followup.  Patient Instructions: 1)  Your physician recommends that you schedule a follow-up appointment in: 6 months 2)  Your physician has recommended you make the following change in your medication: please take i/2 of your metoprolol dose for 1 week, then discontinue 3)  Your physician recommends that you return for lab work [id number] have LIPIDS and LIVER labs at pt's convenience

## 2010-02-05 NOTE — Progress Notes (Signed)
Summary: pt has questions regarding medication  Phone Note Call from Patient Call back at Home Phone [phone]   Caller: Patient Reason for Call: Talk to Nurse, Talk to Doctor Summary of Call: pt has questions regarding his medications Initial call taken by: Omer Jack,  December 25, 2009 9:05 AM  Follow-up for Phone Call        pt is almost out of Effient and wasn't sure if he should cont. it or if it could be stopped since it has been a year, will send mess to Dr Myrtis Ser for review Meredith Staggers, RN  December 25, 2009 10:06 AM   Additional Follow-up for Phone Call Additional follow up Details #1::        I would prefer that he stay on the drug for now. Lets move his next visit up to February.  I will discuss fully with him at that time the pros and cons of continuing the medicine at that point     Appended Document: pt has questions regarding medication I meant to send this to you  Appended Document: pt has questions regarding medication pt is aware, refills sent in he will c/b to sch appt Meredith Staggers, RN  December 25, 2009 6:53 PM    Clinical Lists Changes  Medications: Rx of EFFIENT 10 MG TABS (PRASUGREL HCL) Take one tablet by mouth daily;  #30 x 6;  Signed;  Entered by: Meredith Staggers, RN;  Authorized by: Talitha Givens, MD, [facility];  Method used: Electronically to [facility], [place], [place], [place]  [address], Ph: [phone], Fax: [phone]    Prescriptions: EFFIENT 10 MG TABS (PRASUGREL HCL) Take one tablet by mouth daily  #30 x 6   Entered by:   Meredith Staggers, RN   Authorized by:   Talitha Givens, MD, [facility]   Signed by:   Meredith Staggers, RN on 12/25/2009   Method used:   Electronically to        [facility]  [address] (retail)       [address]       [place], [place]  [address]       Ph: [phone]       Fax: [phone]   RxID:   [id number]

## 2010-02-05 NOTE — Miscellaneous (Signed)
  Clinical Lists Changes  Observations: Added new observation of CARDCATHFIND: ASSESSMENT: 1. Coronary artery disease, as described above.  The right coronary     artery stent is patent.  There is minimal nonobstructive plaquing     on the left side. 2. Very mild in-stent restenosis as described above. 3. Normal left ventricular ejection fraction.   PLAN/DISCUSSION:  Given his angiography, I doubt his chest pain is ischemic in nature.  We will check a D-dimer.  If this is normal, we will discharge him home today with very close followup.  We will start a proton pump inhibitor to see if this helps with his symptoms.         Bevelyn Buckles. Bensimhon, MD         (01/10/2010 9:08)      Cardiac Cath  Procedure date:  01/10/2010  Findings:      ASSESSMENT: 1. Coronary artery disease, as described above.  The right coronary     artery stent is patent.  There is minimal nonobstructive plaquing     on the left side. 2. Very mild in-stent restenosis as described above. 3. Normal left ventricular ejection fraction.   PLAN/DISCUSSION:  Given his angiography, I doubt his chest pain is ischemic in nature.  We will check a D-dimer.  If this is normal, we will discharge him home today with very close followup.  We will start a proton pump inhibitor to see if this helps with his symptoms.         Bevelyn Buckles. Bensimhon, MD

## 2010-02-05 NOTE — Progress Notes (Signed)
Summary: having some chest pains off and on x 1 week   Phone Note Call from Patient   Caller: Patient [phone] Reason for Call: Talk to Nurse Summary of Call: pt having some chest pains off and on  x 1 week Initial call taken by: Glynda Jaeger,  January 08, 2010 9:03 AM  Follow-up for Phone Call        attempted to call pt, VM not set up yet, will try again later Meredith Staggers, RN  January 08, 2010 11:46 AM   still unable to reach pt Meredith Staggers, RN  January 08, 2010 12:49 PM  still unable to reach pt or leave a mess Meredith Staggers, RN  January 08, 2010 2:26 PM  still unable to reach pt Meredith Staggers, RN  January 08, 2010 5:11 PM   Additional Follow-up for Phone Call Additional follow up Details #1::        still unable to reach pt Meredith Staggers, RN  January 09, 2010 3:29 PM   pt was admitted and had cath on 1/6 [facility], RN  January 12, 2010 1:19 PM

## 2010-02-05 NOTE — Assessment & Plan Note (Signed)
Summary: eph   Primary Provider:  Dr. Lysbeth Galas  CC:  follow up.  History of Present Illness: Primary Cardiologist:  Dr. Zackery Barefoot  Dennis Terry is a 45 yo male with a h/o CAD, s/p NSTEMI in 12/2008 tx'd with a DES to the RCA who presented to [facility] on 01/08/2010 with chest pain.  He r/o for MI.  Cardiac cath demonstrated a mLAD 20%; RCA 30% ISR and 40% after the stent.  EF was 65%.  A Ddimer was negative.  He was started on a PPI.  He returns for follow up.  Labs: TC 172, TG 196, HDL 28, LDL 105 (01/09/10) A1C 6.2  He never started on the proton pump inhibitor.  He has not had any further chest discomfort.  He denies shortness of breath.  Denies syncope.  Denies orthopnea, PND or pedal edema.  He has significant symptoms of myalgias with statins.  He was placed on Livalo by his PCP.  He can only take it a few times a week.  Current Medications (verified): 1)  Aspirin Ec 325 Mg Tbec (Aspirin) .... Take One Tablet By Mouth Daily 2)  Effient 10 Mg Tabs (Prasugrel Hcl) .... Take One Tablet By Mouth Daily 3)  Nitrostat 0.4 Mg Subl (Nitroglycerin) .Marland Kitchen.. 1 Tablet Under Tongue At Onset of Chest Pain; You May Repeat Every 5 Minutes For Up To 3 Doses. 4)  Livalo 4 Mg Tabs (Pitavastatin Calcium) .... 1/2 Tab Every Other Day or Every 3 Days Depends On How Pt Feels.Marland Kitchen 5)  Red Yeast Rice 600 Mg Caps (Red Yeast Rice Extract) .... 2 Tabs By Mouth At Bedtime 6)  Fish Oil   Oil (Fish Oil) .Marland Kitchen.. 1 Tab By Mouth Once Daily  Allergies: 1)  ! Percocet  Past History:  Past Medical History: BRADYCARDIA (ICD-427.89) HYPOTENSION, UNSPECIFIED (ICD-458.9) HYPERLIPIDEMIA-MIXED (ICD-272.4) Acute coronary syndrome...12/29/2008 CAD, NATIVE VESSEL (ICD-414.01)...cath...12/29/2008...occluded RCA...successful DES.Marland KitchenMarland KitchenNahser   a. cath 01/09/10: mLAD 20%; RCA stent 30%, 40% after stent; EF 65% EF 55%...echo...12/30/2008...mod/severe hypo  base/mid inferior wall Smoking Hx...  Stopped... December, 2010    Review of  Systems       As per  the HPI.  All other systems reviewed and negative.   Vital Signs:  Patient profile:   45 year old male Height:      69 inches Weight:      218 pounds BMI:     32.31 Pulse rate:   67 / minute Resp:     14 per minute BP sitting:   129 / 77  (left arm)  Vitals Entered By: Kem Parkinson (January 26, 2010 9:10 AM)  Physical Exam  General:  Well nourished, well developed, in no acute distress HEENT: normal Neck: no JVD Cardiac:  normal S1, S2; RRR; no murmur Lungs:  clear to auscultation bilaterally, no wheezing, rhonchi or rales Abd: soft, nontender, no hepatomegaly Ext: no edema; right radial site without hematoma or bruit Vascular: no carotid  bruits Skin: warm and dry Neuro:  CNs 2-12 intact, no focal abnormalities noted    Impression & Recommendations:  Problem # 1:  CAD, NATIVE VESSEL (ICD-414.01) No angina.  Continue ASA and Effient. Recent cath with nonobs disease.  Problem # 2:  HYPERLIPIDEMIA-MIXED (ICD-272.4) He is followed by his PCP. He has significant symptoms of myalgias with statins.    Problem # 3:  TOBACCO ABUSE (ICD-305.1) Denies smoking now.  Problem # 4:  Glucose Intolerance A1C 6.2 in the hospital. Follow up with PCP.  Patient Instructions: 1)  Your physician wants you to follow-up in: 4 months with Dr. Myrtis Ser. You will receive a reminder letter in the mail two months in advance. If you don't receive a letter, please call our office to schedule the follow-up appointment. 2)  Your physician recommends that you continue on your current medications as directed. Please refer to the Current Medication list given to you today.

## 2010-03-22 LAB — CBC
HCT: 40.3 % (ref 39.0–52.0)
Hemoglobin: 13.8 g/dL (ref 13.0–17.0)
MCH: 31 pg (ref 26.0–34.0)
MCHC: 34.3 g/dL (ref 30.0–36.0)
MCV: 90.5 fL (ref 78.0–100.0)
Platelets: 207 10*3/uL (ref 150–400)
RBC: 4.46 MIL/uL (ref 4.22–5.81)
RDW: 13.8 % (ref 11.5–15.5)
WBC: 7.4 10*3/uL (ref 4.0–10.5)

## 2010-03-22 LAB — DIFFERENTIAL
Basophils Absolute: 0 10*3/uL (ref 0.0–0.1)
Basophils Relative: 1 % (ref 0–1)
Eosinophils Absolute: 0.4 10*3/uL (ref 0.0–0.7)
Eosinophils Relative: 6 % — ABNORMAL HIGH (ref 0–5)
Lymphocytes Relative: 29 % (ref 12–46)
Lymphs Abs: 2.1 10*3/uL (ref 0.7–4.0)
Monocytes Absolute: 0.7 10*3/uL (ref 0.1–1.0)
Monocytes Relative: 9 % (ref 3–12)
Neutro Abs: 4.1 10*3/uL (ref 1.7–7.7)
Neutrophils Relative %: 56 % (ref 43–77)

## 2010-03-22 LAB — POCT I-STAT, CHEM 8
BUN: 16 mg/dL (ref 6–23)
Calcium, Ion: 1.15 mmol/L (ref 1.12–1.32)
Chloride: 105 mEq/L (ref 96–112)
Creatinine, Ser: 1 mg/dL (ref 0.4–1.5)
Glucose, Bld: 115 mg/dL — ABNORMAL HIGH (ref 70–99)
HCT: 42 % (ref 39.0–52.0)
Hemoglobin: 14.3 g/dL (ref 13.0–17.0)
Potassium: 3.6 mEq/L (ref 3.5–5.1)
Sodium: 141 mEq/L (ref 135–145)
TCO2: 26 mmol/L (ref 0–100)

## 2010-03-22 LAB — ROCKY MTN SPOTTED FVR AB, IGM-BLOOD: RMSF IgM: 0.52 IV (ref 0.00–0.89)

## 2010-03-22 LAB — ROCKY MTN SPOTTED FVR AB, IGG-BLOOD: RMSF IgG: 1.87 IV — ABNORMAL HIGH

## 2010-04-06 LAB — CBC
HCT: 38.7 % — ABNORMAL LOW (ref 39.0–52.0)
HCT: 42.3 % (ref 39.0–52.0)
HCT: 45 % (ref 39.0–52.0)
Hemoglobin: 13.3 g/dL (ref 13.0–17.0)
Hemoglobin: 14.4 g/dL (ref 13.0–17.0)
Hemoglobin: 15.7 g/dL (ref 13.0–17.0)
MCHC: 34.1 g/dL (ref 30.0–36.0)
MCHC: 34.5 g/dL (ref 30.0–36.0)
MCHC: 34.9 g/dL (ref 30.0–36.0)
MCV: 90.5 fL (ref 78.0–100.0)
MCV: 91.1 fL (ref 78.0–100.0)
MCV: 91.7 fL (ref 78.0–100.0)
Platelets: 206 10*3/uL (ref 150–400)
Platelets: 222 10*3/uL (ref 150–400)
Platelets: 222 10*3/uL (ref 150–400)
RBC: 4.25 MIL/uL (ref 4.22–5.81)
RBC: 4.61 MIL/uL (ref 4.22–5.81)
RBC: 4.97 MIL/uL (ref 4.22–5.81)
RDW: 13.5 % (ref 11.5–15.5)
RDW: 13.5 % (ref 11.5–15.5)
RDW: 13.6 % (ref 11.5–15.5)
WBC: 6 10*3/uL (ref 4.0–10.5)
WBC: 8.2 10*3/uL (ref 4.0–10.5)
WBC: 9.8 10*3/uL (ref 4.0–10.5)

## 2010-04-06 LAB — BASIC METABOLIC PANEL WITH GFR
BUN: 11 mg/dL (ref 6–23)
CO2: 26 meq/L (ref 19–32)
Calcium: 8.4 mg/dL (ref 8.4–10.5)
Chloride: 108 meq/L (ref 96–112)
Creatinine, Ser: 0.79 mg/dL (ref 0.4–1.5)
GFR calc non Af Amer: >60 mL/min
Glucose, Bld: 125 mg/dL — ABNORMAL HIGH (ref 70–99)
Potassium: 4 meq/L (ref 3.5–5.1)
Sodium: 139 meq/L (ref 135–145)

## 2010-04-06 LAB — CARDIAC PANEL(CRET KIN+CKTOT+MB+TROPI)
CK, MB: 17.1 ng/mL — ABNORMAL HIGH (ref 0.3–4.0)
CK, MB: 30.4 ng/mL — ABNORMAL HIGH (ref 0.3–4.0)
CK, MB: 32.6 ng/mL — ABNORMAL HIGH (ref 0.3–4.0)
CK, MB: 64.9 ng/mL — ABNORMAL HIGH (ref 0.3–4.0)
Relative Index: 10.2 — ABNORMAL HIGH (ref 0.0–2.5)
Relative Index: 8.4 — ABNORMAL HIGH (ref 0.0–2.5)
Relative Index: 8.4 — ABNORMAL HIGH (ref 0.0–2.5)
Relative Index: 9.8 — ABNORMAL HIGH (ref 0.0–2.5)
Total CK: 204 U/L (ref 7–232)
Total CK: 332 U/L — ABNORMAL HIGH (ref 7–232)
Total CK: 360 U/L — ABNORMAL HIGH (ref 7–232)
Total CK: 636 U/L — ABNORMAL HIGH (ref 7–232)
Troponin I: 6.26 ng/mL (ref 0.00–0.06)
Troponin I: 9.46 ng/mL (ref 0.00–0.06)

## 2010-04-06 LAB — POCT CARDIAC MARKERS
CKMB, poc: 2.7 ng/mL (ref 1.0–8.0)
Myoglobin, poc: 74.9 ng/mL (ref 12–200)
Troponin i, poc: 0.25 ng/mL (ref 0.00–0.09)

## 2010-04-06 LAB — BASIC METABOLIC PANEL
BUN: 15 mg/dL (ref 6–23)
CO2: 24 mEq/L (ref 19–32)
Calcium: 8.9 mg/dL (ref 8.4–10.5)
Chloride: 106 mEq/L (ref 96–112)
Creatinine, Ser: 0.86 mg/dL (ref 0.4–1.5)
GFR calc Af Amer: >60 mL/min (ref >60)
GFR calc non Af Amer: >60 mL/min (ref >60)
Glucose, Bld: 126 mg/dL — ABNORMAL HIGH (ref 70–99)
Potassium: 3.4 mEq/L — ABNORMAL LOW (ref 3.5–5.1)
Sodium: 139 mEq/L (ref 135–145)

## 2010-04-06 LAB — TSH: TSH: 1.177 u[IU]/mL (ref 0.350–4.500)

## 2010-04-06 LAB — HEPARIN LEVEL (UNFRACTIONATED): Heparin Unfractionated: 0.15 IU/mL — ABNORMAL LOW (ref 0.30–0.70)

## 2010-04-06 LAB — CK TOTAL AND CKMB (NOT AT ARMC)
CK, MB: 8.6 ng/mL — ABNORMAL HIGH (ref 0.3–4.0)
Relative Index: 5.9 — ABNORMAL HIGH (ref 0.0–2.5)
Total CK: 147 U/L (ref 7–232)

## 2010-04-06 LAB — D-DIMER, QUANTITATIVE

## 2010-04-06 LAB — LIPID PANEL
Cholesterol: 208 mg/dL — ABNORMAL HIGH (ref 0–200)
HDL: 23 mg/dL — ABNORMAL LOW (ref >39)
LDL Cholesterol: 135 mg/dL — ABNORMAL HIGH (ref 0–99)
Total CHOL/HDL Ratio: 9 RATIO
Triglycerides: 250 mg/dL — ABNORMAL HIGH (ref <150)
VLDL: 50 mg/dL — ABNORMAL HIGH (ref 0–40)

## 2010-04-06 LAB — PROTIME-INR
INR: 1.03 (ref 0.00–1.49)
Prothrombin Time: 13.4 seconds (ref 11.6–15.2)

## 2010-04-06 LAB — TROPONIN I: Troponin I: 0.77 ng/mL (ref 0.00–0.06)

## 2010-05-19 NOTE — Op Note (Signed)
NAME:  Dennis Terry, Dennis Terry                ACCOUNT NO.:  [id number]   MEDICAL RECORD NO.:  [id number]          PATIENT TYPE:  OIB   LOCATION:  5121                         FACILITY:  [facility]   PHYSICIAN:  Alfonse Ras, MD   DATE OF BIRTH:  20-Sep-1965   DATE OF PROCEDURE:  05/17/2007  DATE OF DISCHARGE:                               OPERATIVE REPORT   PREOPERATIVE DIAGNOSIS:  Acute appendicitis.   POSTOPERATIVE DIAGNOSIS:  Acute and chronic appendicitis.   PROCEDURE:  Laparoscopic converted to open appendectomy.   SURGEON:  Alfonse Ras, M.D.   ASSISTANT:  Cherylynn Ridges, M.D.   ANESTHESIA:  General.   DESCRIPTION:  The patient was taken to the operating and placed in  supine position.  After adequate general anesthesia was induced using  endotracheal tube, the abdomen was prepped and draped in the normal  sterile fashion.  Foley catheter was placed.  Using a 12-mm Optiview  trocar in the left upper quadrant under direct vision, the peritoneum  was entered.  Pneumoperitoneum was obtained.  A 5-mm trocar was placed  in the mid abdomen and 12-mm trocar was placed in the left lower  quadrant.  The cecum was identified.  It was riding quite high in the  near right upper quadrant.  A tedious dissection to mobilize the entire  cecum was undertaken.  The terminal ileum was identified.  There was  some inflammation there.  However, it was difficult to identify the  appendix even after significant mobilization of the cecum up to the  hepatic flexure.  There was an area of inflamed fat, which I transected  using a GIA stapling device right at the edge of the cecum.  However, on  dissection on the back table, it only appeared to have a cuff of cecum  and no evidence of appendix.  At this point, I asked for Dr. Jimmye Norman  to assist me.  He came in and also looked the mobilized cecum and we  both compared again the CT scan, which showed the orifice of the  appendix to be quite medial.  Even  after adequate mobilization, we are  unable to identify the orifice of the appendix.  For this reason, we  opted to open.  A transverse incision was made in the right upper  quadrant, dissected down through the anterior and posterior fascial  layers, and divided the oblique musculature.  Peritoneum was entered.  The cecum was delivered up and through the wound and again a very hard  firm mass could be palpated heading up towards the hepatic flexure.  This appeared to be a very encased previously perforated appendix.  The  base of the appendix could be mobilized, and then mesoappendix was taken  down with the harmonic scalpel.  The appendiceal stump however was only  about 3 cm in length.  It was transected at the cecum using a GIA  blue-load stapling device.  Adequate hemostasis was ensured.  The  abdomen was copiously irrigated.  The fascia was closed in two layers  using running #1 Novofil.  Skin incisions  were closed with staples.  Sterile dressings were applied.  The patient tolerated the procedure  well and went to PACU in good condition.      Alfonse Ras, MD  Electronically Signed     KRE/MEDQ  D:  05/17/2007  T:  05/18/2007  Job:  [id number]
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
## 2010-05-22 NOTE — Discharge Summary (Signed)
NAME:  Dennis Terry, Dennis Terry NO.:  [id number]   MEDICAL RECORD NO.:  [id number]          PATIENT TYPE:  INP   LOCATION:  5121                         FACILITY:  [facility]   PHYSICIAN:  Alfonse Ras, MD   DATE OF BIRTH:  03/09/1965   DATE OF ADMISSION:  05/17/2007  DATE OF DISCHARGE:  05/20/2007                               DISCHARGE SUMMARY   PROCEDURE:  A laparoscopic converted to open appendectomy by Dr. Colin Benton  on May 17, 2007. __________ there were none.   HISTORY OF PRESENT ILLNESS:  This is a 45 year old white male who had  presented to Dr. Haywood Pao office the morning of admission complaining  of  diffuse abdominal pain.  The patient has had similar episodes  approximately 1 year ago but they eventually went away.  He states that  Sunday  prior to admission is when he began to develop the pain and he  also became dizzy with some nausea and vomiting.  He therefore went to  Dr. Haywood Pao office for further GI workup where a CT scan done which  revealed a retrocecal appendicitis. He was then sent to the ER.  Please  see admitting history and physical for further details.   ADMITTING DIAGNOSIS:  Appendicitis, questionable acute on chronic  appendicitis.   HOSPITAL COURSE:  The patient is taken from the ER to the operating room  where a laparoscopic converted to an open appendectomy was done for  acute on chronic retrocecal appendicitis.  There appeared to be chronic  rupture.  On postoperative day 1, the patient was feeling poor and his  abdomen was tight and bloated.  At thattime, his white blood cell count  was 12,800.  At this time, he was continued on clear liquids.  On  postoperative day 2, he was feeling better and his pain was  better  controlled with Toradol.  He was not having any flatus but he had any  nausea or vomiting.  On exam, his abdomen was soft, slightly distended  with no bowel sounds.  However, at this time, his diet was advanced to  regular diet.   By postoperative day 3, the patient was not having any  other pain issues.  He was well controlled with Toradol.  On exam, his  abdomen was somewhat distended, but it was not tender with clean  incisions.  At this time, the patient was felt stable for discharge with  followup early next week for staple removal.   DISCHARGE DIAGNOSES:  1. Acute on chronic appendicitis.  2. Status post open appendectomy.   DISCHARGE MEDICATIONS:  The patient was told that he may take Toradol 10  mg p.o. every 6 hours as needed for pain.  He was also informed that he  may take over-the-counter stool softener as needed for constipation.   INSTRUCTIONS:  The patient was informed that he may return to work in 6  weeks.  He has no restrictions on his diet.  He is able to shower;  however, he is not able to lift anything greater than about 10 or 15  pounds  for the next 3-  4 weeks.  Otherwise, he is to follow our office if he gets a  fever  greater than 101.5, severe abdominal pain or redness or pus or drainage  from his incision.  He is also informed to return to the __________  Clinic on May 24, 2007, for staple removal.  Otherwise, he is to follow  up with Dr. Colin Benton after that.      Letha Cape, PA      Alfonse Ras, MD  Electronically Signed    KEO/MEDQ  D:  06/20/2007  T:  06/21/2007  Job:  [phone]

## 2011-05-18 ENCOUNTER — Other Ambulatory Visit: Payer: Self-pay | Admitting: Physician Assistant

## 2012-06-10 IMAGING — CR DG CHEST 2V
2 series · 2 of 2 positions shown · non-contrast
Comparison: Chest radiograph 12/29/2008

CLINICAL DATA: Chest pain, myocardial infarction

CHEST - 2 VIEW

[w chest pa]
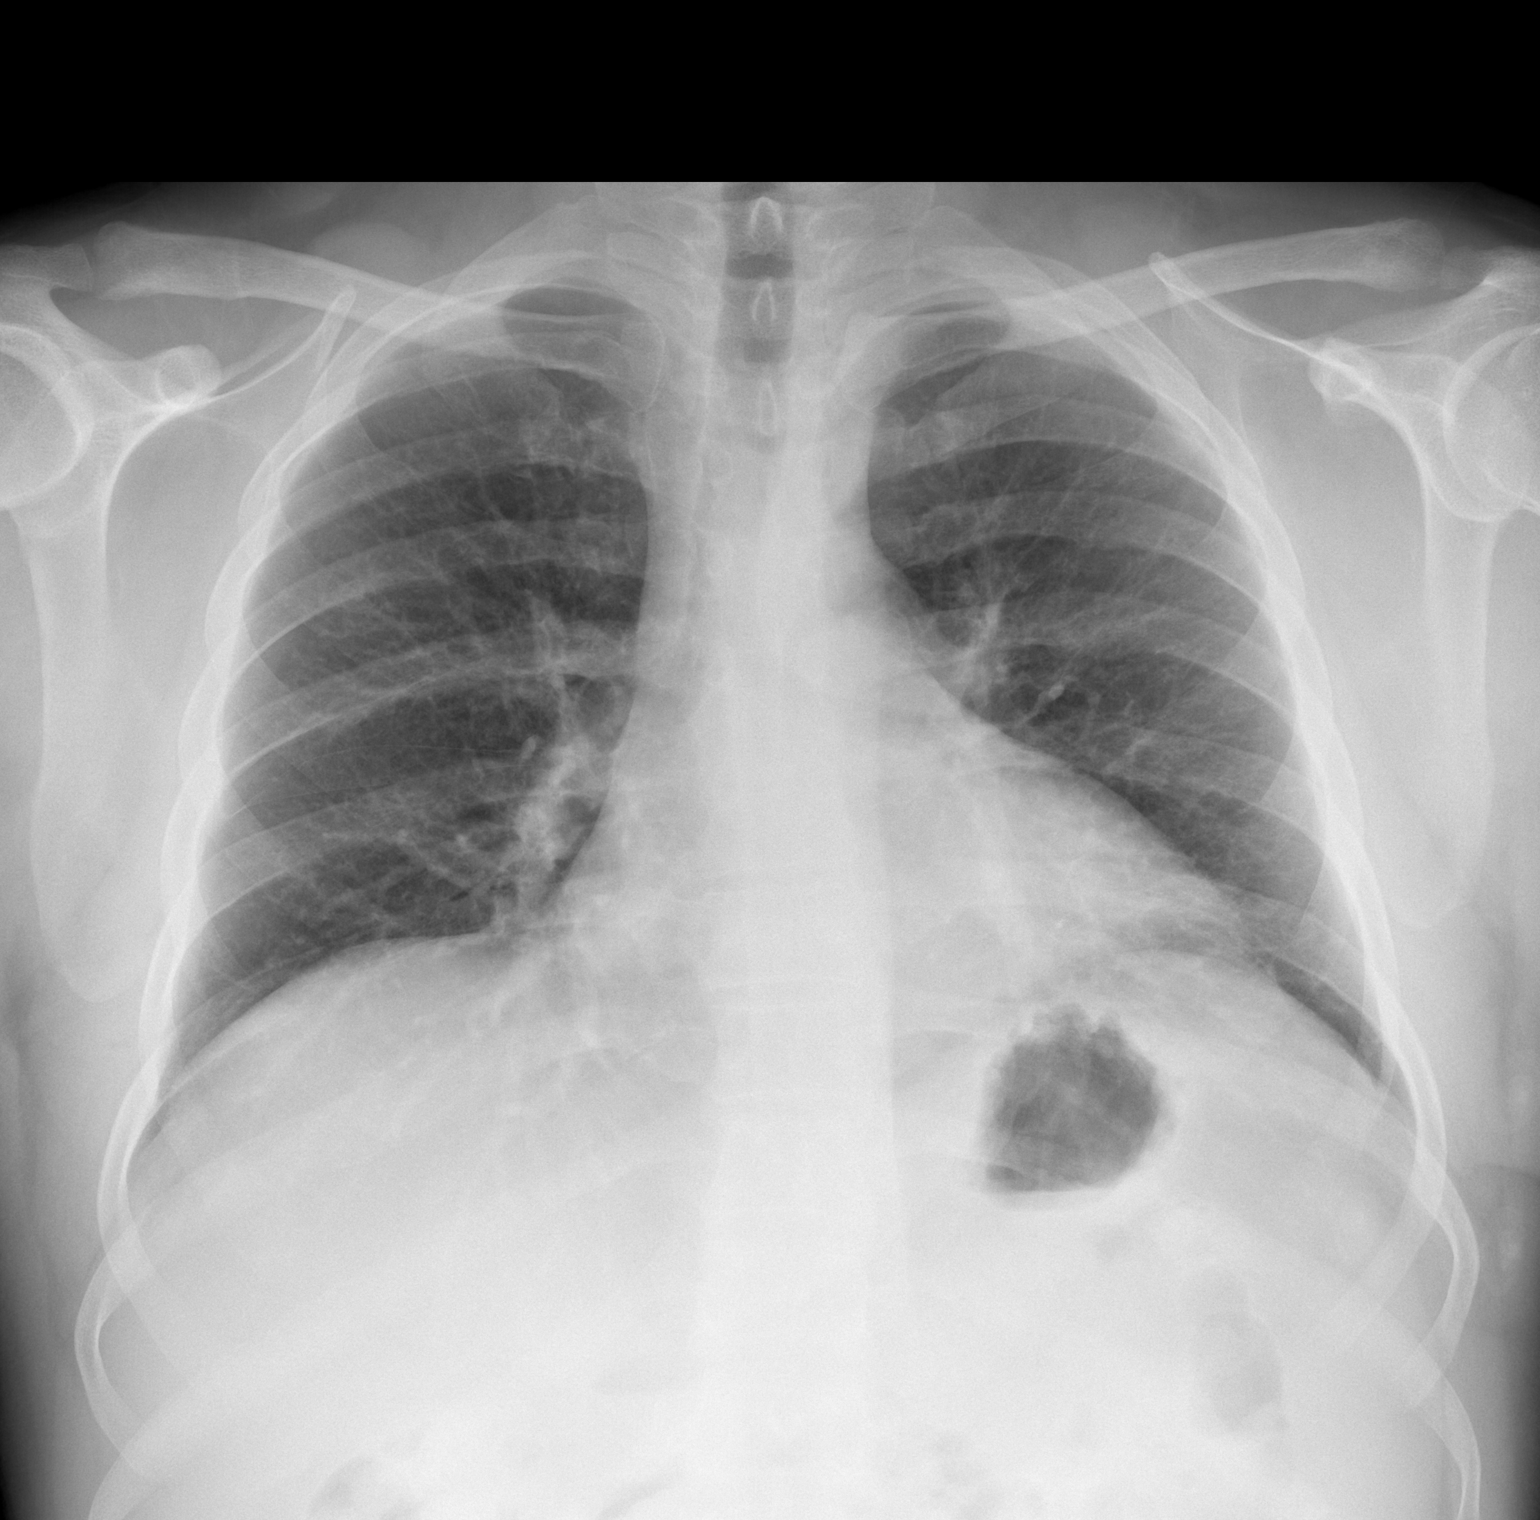

[w chest lat]
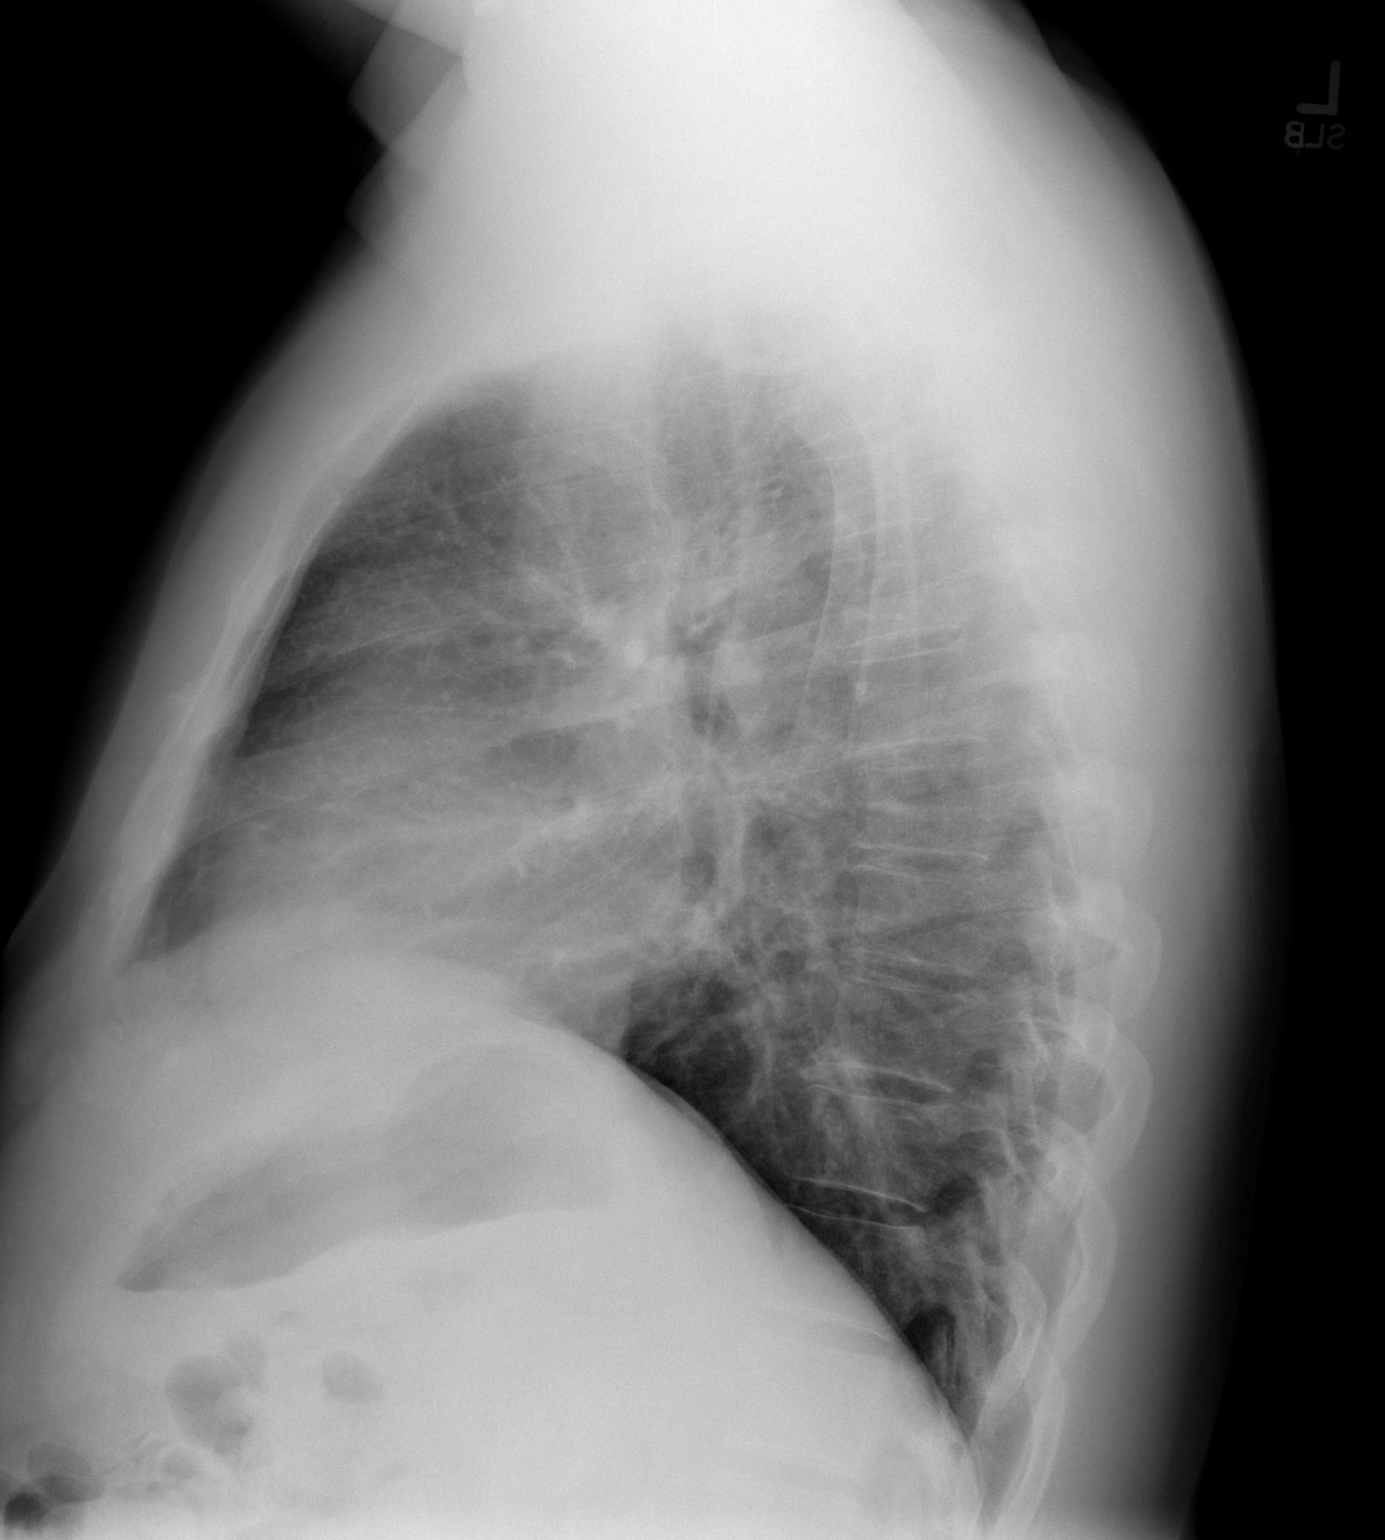

[2 of 2 positions shown; findings below may reference images not displayed]

FINDINGS: Normal mediastinum and heart silhouette.  Costophrenic
angles are clear.  No evidence effusion, infiltrate, or
pneumothorax.  There is chronic central bronchitic markings.
IMPRESSION: 1.  No acute cardiopulmonary process.
2.  Chronic bronchitic markings.

## 2014-02-04 DIAGNOSIS — J189 Pneumonia, unspecified organism: Secondary | ICD-10-CM

## 2014-02-04 HISTORY — DX: Pneumonia, unspecified organism: J18.9

## 2014-02-26 ENCOUNTER — Emergency Department (HOSPITAL_COMMUNITY)
Admission: EM | Admit: 2014-02-26 | Discharge: 2014-02-27 | Disposition: A | Discharge status: Home / Self Care | Payer: BLUE CROSS/BLUE SHIELD | Attending: Emergency Medicine | Admitting: Emergency Medicine

## 2014-02-26 ENCOUNTER — Encounter (HOSPITAL_COMMUNITY): Payer: Self-pay | Admitting: *Deleted

## 2014-02-26 DIAGNOSIS — Z8639 Personal history of other endocrine, nutritional and metabolic disease: Secondary | ICD-10-CM | POA: Diagnosis not present

## 2014-02-26 DIAGNOSIS — I252 Old myocardial infarction: Secondary | ICD-10-CM | POA: Insufficient documentation

## 2014-02-26 DIAGNOSIS — R079 Chest pain, unspecified: Secondary | ICD-10-CM | POA: Diagnosis not present

## 2014-02-26 DIAGNOSIS — R61 Generalized hyperhidrosis: Secondary | ICD-10-CM | POA: Insufficient documentation

## 2014-02-26 DIAGNOSIS — J069 Acute upper respiratory infection, unspecified: Secondary | ICD-10-CM | POA: Insufficient documentation

## 2014-02-26 DIAGNOSIS — Z9861 Coronary angioplasty status: Secondary | ICD-10-CM | POA: Diagnosis not present

## 2014-02-26 DIAGNOSIS — I2510 Atherosclerotic heart disease of native coronary artery without angina pectoris: Secondary | ICD-10-CM | POA: Insufficient documentation

## 2014-02-26 DIAGNOSIS — M549 Dorsalgia, unspecified: Secondary | ICD-10-CM | POA: Insufficient documentation

## 2014-02-26 DIAGNOSIS — Z79899 Other long term (current) drug therapy: Secondary | ICD-10-CM | POA: Diagnosis not present

## 2014-02-26 DIAGNOSIS — I10 Essential (primary) hypertension: Secondary | ICD-10-CM | POA: Insufficient documentation

## 2014-02-26 HISTORY — DX: Acute myocardial infarction, unspecified: I21.9

## 2014-02-26 NOTE — ED Notes (Signed)
Patient presents stating he has been having chest pain off and on for several days but could not decide if it was from his cold

## 2014-02-27 ENCOUNTER — Other Ambulatory Visit: Payer: Self-pay | Admitting: Physician Assistant

## 2014-02-27 ENCOUNTER — Emergency Department (HOSPITAL_COMMUNITY): Payer: BLUE CROSS/BLUE SHIELD

## 2014-02-27 DIAGNOSIS — R079 Chest pain, unspecified: Secondary | ICD-10-CM

## 2014-02-27 LAB — BASIC METABOLIC PANEL
Anion gap: 5 (ref 5–15)
BUN: 17 mg/dL (ref 6–23)
CO2: 27 mmol/L (ref 19–32)
Calcium: 9 mg/dL (ref 8.4–10.5)
Chloride: 104 mmol/L (ref 96–112)
Creatinine, Ser: 1.07 mg/dL (ref 0.50–1.35)
GFR calc Af Amer: >90 mL/min (ref >90)
GFR calc non Af Amer: 80 mL/min — ABNORMAL LOW (ref >90)
Glucose, Bld: 126 mg/dL — ABNORMAL HIGH (ref 70–99)
Potassium: 3.4 mmol/L — ABNORMAL LOW (ref 3.5–5.1)
Sodium: 136 mmol/L (ref 135–145)

## 2014-02-27 LAB — TROPONIN I
Troponin I: <0.03 ng/mL (ref <0.031)
Troponin I: <0.03 ng/mL (ref <0.031)

## 2014-02-27 LAB — CBC WITH DIFFERENTIAL/PLATELET
Basophils Absolute: 0 10*3/uL (ref 0.0–0.1)
Basophils Relative: 1 % (ref 0–1)
Eosinophils Absolute: 0.2 10*3/uL (ref 0.0–0.7)
Eosinophils Relative: 4 % (ref 0–5)
HCT: 43.7 % (ref 39.0–52.0)
Hemoglobin: 14.8 g/dL (ref 13.0–17.0)
Lymphocytes Relative: 34 % (ref 12–46)
Lymphs Abs: 2 10*3/uL (ref 0.7–4.0)
MCH: 30.4 pg (ref 26.0–34.0)
MCHC: 33.9 g/dL (ref 30.0–36.0)
MCV: 89.7 fL (ref 78.0–100.0)
Monocytes Absolute: 0.4 10*3/uL (ref 0.1–1.0)
Monocytes Relative: 7 % (ref 3–12)
Neutro Abs: 3.1 10*3/uL (ref 1.7–7.7)
Neutrophils Relative %: 54 % (ref 43–77)
Platelets: 180 10*3/uL (ref 150–400)
RBC: 4.87 MIL/uL (ref 4.22–5.81)
RDW: 13.7 % (ref 11.5–15.5)
WBC: 5.7 10*3/uL (ref 4.0–10.5)

## 2014-02-27 MED ORDER — ASPIRIN EC 325 MG PO TBEC
325.0000 mg | DELAYED_RELEASE_TABLET | Freq: Every day | ORAL | Status: DC
  Administered 2014-02-27: 325 mg via ORAL
  Filled 2014-02-27: qty 1

## 2014-02-27 MED ORDER — NITROGLYCERIN 0.4 MG SL SUBL
0.4000 mg | SUBLINGUAL_TABLET | SUBLINGUAL | Status: DC | PRN
  Administered 2014-02-27 (×2): 0.4 mg via SUBLINGUAL
  Filled 2014-02-27: qty 1

## 2014-02-27 NOTE — ED Provider Notes (Signed)
CSN: [id number]     Arrival date & time 02/26/14  2343 History  This chart was scribed for Dennis Mackielga M Aubrionna Istre, MD by Murriel HopperAlec Bankhead, ED Scribe. This patient was seen in room D35C/D35C and the patient's care was started at 12:27 AM.    Chief Complaint  Patient presents with  . Chest Pain     The history is provided by the patient. No language interpreter was used.     HPI Comments: Dennis Terry is a 49 y.o. male who presents to the Emergency Department complaining of intermittent, dull, aching left-sided chest pain with associated diaphoresis that has been present for several days. Pt states his pain lasts for 30 minutes at a time, and onset of pain is random. Pt states that while he is at work he occasionally has diaphoresis while his chest pain is present. Pt states that he has had cold-like symptoms for 4 days in addition to chest pain. Pt states that he had an MI back in 2010 and had a stent placed in his heart. Pt also notes an intermittent cough that is non-productive, and states he has back pain on occasion as well. Pt denies fever, jaw pain, or arm pain.         Past Medical History  Diagnosis Date  . Myocardial infarction    Past Surgical History  Procedure Laterality Date  . Coronary angioplasty with stent placement     No family history on file. History  Substance Use Topics  . Smoking status: Former Games developermoker  . Smokeless tobacco: Never Used  . Alcohol Use: No    Review of Systems  Constitutional: Positive for diaphoresis. Negative for fever.  Respiratory: Positive for cough.   Cardiovascular: Positive for chest pain.  Musculoskeletal: Positive for back pain.  All other systems reviewed and are negative.     Allergies  Oxycodone-acetaminophen  Home Medications   Prior to Admission medications   Medication Sig Start Date End Date Taking? Authorizing Provider  NITROSTAT 0.4 MG SL tablet DISSOLVE ONE TABLET UNDER THE TONGUE EVERY 5 MINUTES AS NEEDED FOR CHEST PAIN.   DO NOT EXCEED A TOTAL OF 3 DOSES IN 15 MINUTES 05/18/11   Rhonda G Barrett, PA-C   BP 159/90 mmHg  Pulse 83  Temp(Src) 98.5 F (36.9 C)  Resp 18  Ht 5\' 9"  (1.753 m)  Wt 220 lb (99.791 kg)  BMI 32.47 kg/m2  SpO2 97% Physical Exam  Constitutional: He is oriented to person, place, and time. He appears well-developed and well-nourished.  HENT:  Head: Normocephalic and atraumatic.  Nose: Nose normal.  Mouth/Throat: Oropharynx is clear and moist.  Eyes: Conjunctivae and EOM are normal. Pupils are equal, round, and reactive to light.  Neck: Normal range of motion. Neck supple. No JVD present. No tracheal deviation present. No thyromegaly present.  Cardiovascular: Normal rate, regular rhythm, normal heart sounds and intact distal pulses.  Exam reveals no gallop and no friction rub.   No murmur heard. Pulmonary/Chest: Effort normal and breath sounds normal. No stridor. No respiratory distress. He has no wheezes. He has no rales. He exhibits no tenderness.  Abdominal: Soft. Bowel sounds are normal. He exhibits no distension and no mass. There is no tenderness. There is no rebound and no guarding.  Musculoskeletal: Normal range of motion. He exhibits no edema or tenderness.  Lymphadenopathy:    He has no cervical adenopathy.  Neurological: He is alert and oriented to person, place, and time. He displays normal reflexes. He exhibits  normal muscle tone. Coordination normal.  Skin: Skin is warm and dry. No rash noted. No erythema. No pallor.  Psychiatric: He has a normal mood and affect. His behavior is normal. Judgment and thought content normal.  Nursing note and vitals reviewed.   ED Course  Procedures (including critical care time)  DIAGNOSTIC STUDIES: Oxygen Saturation is 97% on RA, normal by my interpretation.    COORDINATION OF CARE: 12:39 AM Discussed treatment plan with pt at bedside and pt agreed to plan.   Labs Review Labs Reviewed  BASIC METABOLIC PANEL - Abnormal; Notable  for the following:    Potassium 3.4 (*)    Glucose, Bld 126 (*)    GFR calc non Af Amer 80 (*)    All other components within normal limits  CBC WITH DIFFERENTIAL/PLATELET  TROPONIN I    Imaging Review Dg Chest 2 View  02/27/2014   CLINICAL DATA:  Left chest pain. Recent cold. Stent in 2010. Former smoker.  EXAM: CHEST  2 VIEW  COMPARISON:  01/08/2010  FINDINGS: Shallow inspiration. Normal heart size and pulmonary vascularity. Central interstitial changes suggesting chronic bronchitis. No focal airspace disease or consolidation. No blunting of costophrenic angles. No pneumothorax.  IMPRESSION: Chronic bronchitic changes. No evidence of active pulmonary disease.   Electronically Signed   By: Burman Nieves M.D.   On: 02/27/2014 00:42     EKG Interpretation   Date/Time:  Tuesday February 26 2014 23:47:01 EST Ventricular Rate:  89 PR Interval:  164 QRS Duration: 92 QT Interval:  366 QTC Calculation: 445 R Axis:   6 Text Interpretation:  Sinus rhythm with occasional Premature ventricular  complexes Incomplete right bundle branch block Borderline ECG Confirmed by  Johanthan Kneeland  MD, Nyrah Demos ([id number]) on 02/27/2014 12:25:18 AM      MDM   Final diagnoses:  Chest pain, unspecified chest pain type    49 year old male, history of coronary disease status post stent to RCA in 2010, hypertension, hyperlipidemia, not currently on any medications aside from daily aspirin, who is had 3-4 days of intermittent left-sided chest pain, dull, associated with sweating at times.  Patient reports pain somewhat similar to prior MI.  Also with recent URI symptoms.  EKG with PVCs, but no ischemic changes.  Initial troponin negative.  Pain was worse tonight, prompting him to calm.  Patient arrives hypertensive.  Pain is resolved with 2 nitroglycerin.  Will discuss with cardiology for evaluation.   I personally performed the services described in this documentation, which was scribed in my presence. The recorded  information has been reviewed and is accurate.   2:00 AM Case d/w Dr Shirlee Latch with cardiology who recommends 4 hour trop, and if negative will need to f/u in office this week for outpatient stress test.  Dennis Mackie, MD 02/27/14 [phone]

## 2014-02-27 NOTE — Progress Notes (Signed)
Received call from Dr. Shirlee LatchMcLean requesting to set up outpatient exercise nuclear stress test and f/u. I have left a message on our office's scheduling voicemail requesting a follow-up appointment, and our office will call the patient with this information. Kessa Fairbairn PA-C

## 2014-02-27 NOTE — Discharge Instructions (Signed)
At this time you do not appear to have ongoing heart damage.  Given your history of heart disease, high blood pressure, and elevated cholesterol, it is important for you to follow up with cardiology for further workup of your chest pain.  Return to the ER for worsening condition or new concerning symptoms.   Chest Pain Observation It is often hard to give a specific diagnosis for the cause of chest pain. Among other possibilities your symptoms might be caused by inadequate oxygen delivery to your heart (angina). Angina that is not treated or evaluated can lead to a heart attack (myocardial infarction) or death. Blood tests, electrocardiograms, and X-rays may have been done to help determine a possible cause of your chest pain. After evaluation and observation, your health care provider has determined that it is unlikely your pain was caused by an unstable condition that requires hospitalization. However, a full evaluation of your pain may need to be completed, with additional diagnostic testing as directed. It is very important to keep your follow-up appointments. Not keeping your follow-up appointments could result in permanent heart damage, disability, or death. If there is any problem keeping your follow-up appointments, you must call your health care provider. HOME CARE INSTRUCTIONS  Due to the slight chance that your pain could be angina, it is important to follow your health care provider's treatment plan and also maintain a healthy lifestyle:  Maintain or work toward achieving a healthy weight.  Stay physically active and exercise regularly.  Decrease your salt intake.  Eat a balanced, healthy diet. Talk to a dietitian to learn about heart-healthy foods.  Increase your fiber intake by including whole grains, vegetables, fruits, and nuts in your diet.  Avoid situations that cause stress, anger, or depression.  Take medicines as advised by your health care provider. Report any side effects  to your health care provider. Do not stop medicines or adjust the dosages on your own.  Quit smoking. Do not use nicotine patches or gum until you check with your health care provider.  Keep your blood pressure, blood sugar, and cholesterol levels within normal limits.  Limit alcohol intake to no more than 1 drink per day for women who are not pregnant and 2 drinks per day for men.  Do not abuse drugs. SEEK IMMEDIATE MEDICAL CARE IF: You have severe chest pain or pressure which may include symptoms such as:  You feel pain or pressure in your arms, neck, jaw, or back.  You have severe back or abdominal pain, feel sick to your stomach (nauseous), or throw up (vomit).  You are sweating profusely.  You are having a fast or irregular heartbeat.  You feel short of breath while at rest.  You notice increasing shortness of breath during rest, sleep, or with activity.  You have chest pain that does not get better after rest or after taking your usual medicine.  You wake from sleep with chest pain.  You are unable to sleep because you cannot breathe.  You develop a frequent cough or you are coughing up blood.  You feel dizzy, faint, or experience extreme fatigue.  You develop severe weakness, dizziness, fainting, or chills. Any of these symptoms may represent a serious problem that is an emergency. Do not wait to see if the symptoms will go away. Call your local emergency services (911 in the [place] Do not drive yourself to the hospital. MAKE SURE YOU:  Understand these instructions.  Will watch your condition.  Will get help  right away if you are not doing well or get worse. Document Released: 01/23/2010 Document Revised: 12/26/2012 Document Reviewed: 06/22/2012 [facility] Patient Information 2015 [place]. This information is not intended to replace advice given to you by your health care provider. Make sure you discuss any questions you have with your health care  provider.  Aspirin and Your Heart Aspirin affects the way your blood clots and helps "thin" the blood. Aspirin has many uses in heart disease. It may be used as a primary prevention to help reduce the risk of heart related events. It also can be used as a secondary measure to prevent more heart attacks or to prevent additional damage from blood clots.  ASPIRIN MAY HELP IF YOU:  Have had a heart attack or chest pain.  Have undergone open heart surgery such as CABG (Coronary Artery Bypass Surgery).  Have had coronary angioplasty with or without stents.  Have experienced a stroke or TIA (transient ischemic attack).  Have peripheral vascular disease (PAD).  Have chronic heart rhythm problems such as atrial fibrillation.  Are at risk for heart disease. BEFORE STARTING ASPIRIN Before you start taking aspirin, your caregiver will need to review your medical history. Many things will need to be taken into consideration, such as:  Smoking status.  Blood pressure.  Diabetes.  Gender.  Weight.  Cholesterol level. ASPIRIN DOSES  Aspirin should only be taken on the advice of your caregiver. Talk to your caregiver about how much aspirin you should take. Aspirin comes in different doses such as:  81 mg.  162 mg.  325 mg.  The aspirin dose you take may be affected by many factors, some of which include:  Your current medications, especially if your are taking blood-thinners or anti-platelet medicine.  Liver function.  Heart disease risk.  Age.  Aspirin comes in two forms:  Non-enteric-coated. This type of aspirin does not have a coating and is absorbed faster. Non-enteric coated aspirin is recommended for patients experiencing chest pain symptoms. This type of aspirin also comes in a chewable form.  Enteric-coated. This means the aspirin has a special coating that releases the medicine very slowly. Enteric-coated aspirin causes less stomach upset. This type of aspirin should  not be chewed or crushed. ASPIRIN SIDE EFFECTS Daily use of aspirin can increase your risk of serious side effects. Some of these include:  Increased bleeding. This can range from a cut that does not stop bleeding to more serious problems such as stomach bleeding or bleeding into the brain (Intracerebral bleeding).  Increased bruising.  Stomach upset.  An allergic reaction such as red, itchy skin.  Increased risk of bleeding when combined with non-steroidal anti-inflammatory medicine (NSAIDS).  Alcohol should be drank in moderation when taking aspirin. Alcohol can increase the risk of stomach bleeding when taken with aspirin.  Aspirin should not be given to children less than 518 years of age due to the association of Reye syndrome. Reye syndrome is a serious illness that can affect the brain and liver. Studies have linked Reye syndrome with aspirin use in children.  People that have nasal polyps have an increased risk of developing an aspirin allergy. SEEK MEDICAL CARE IF:   You develop an allergic reaction such as:  Hives.  Itchy skin.  Swelling of the lips, tongue or face.  You develop stomach pain.  You have unusual bleeding or bruising.  You have ringing in your ears. SEEK IMMEDIATE MEDICAL CARE IF:   You have severe chest pain, especially  if the pain is crushing or pressure-like and spreads to the arms, back, neck, or jaw. THIS IS AN EMERGENCY. Do not wait to see if the pain will go away. Get medical help at once. Call your local emergency services (911 in the [place] DO NOT drive yourself to the hospital.  You have stroke-like symptoms such as:  Loss of vision.  Difficulty talking.  Numbness or weakness on one side of your body.  Numbness or weakness in your arm or leg.  Not thinking clearly or feeling confused.  Your bowel movements are bloody, dark red or black in color.  You vomit or cough up blood.  You have blood in your urine.  You have shortness of  breath, coughing or wheezing. MAKE SURE YOU:   Understand these instructions.  Will monitor your condition.  Seek immediate medical care if necessary. Document Released: 12/04/2007 Document Revised: 04/17/2012 Document Reviewed: 03/28/2013 [facility] Patient Information 2015 [place], [place]. This information is not intended to replace advice given to you by your health care provider. Make sure you discuss any questions you have with your health care provider.  Upper Respiratory Infection, Adult An upper respiratory infection (URI) is also known as the common cold. It is often caused by a type of germ (virus). Colds are easily spread (contagious). You can pass it to others by kissing, coughing, sneezing, or drinking out of the same glass. Usually, you get better in 1 or 2 weeks.  HOME CARE   Only take medicine as told by your doctor.  Use a warm mist humidifier or breathe in steam from a hot shower.  Drink enough water and fluids to keep your pee (urine) clear or pale yellow.  Get plenty of rest.  Return to work when your temperature is back to normal or as told by your doctor. You may use a face mask and wash your hands to stop your cold from spreading. GET HELP RIGHT AWAY IF:   After the first few days, you feel you are getting worse.  You have questions about your medicine.  You have chills, shortness of breath, or brown or red spit (mucus).  You have yellow or brown snot (nasal discharge) or pain in the face, especially when you bend forward.  You have a fever, puffy (swollen) neck, pain when you swallow, or white spots in the back of your throat.  You have a bad headache, ear pain, sinus pain, or chest pain.  You have a high-pitched whistling sound when you breathe in and out (wheezing).  You have a lasting cough or cough up blood.  You have sore muscles or a stiff neck. MAKE SURE YOU:   Understand these instructions.  Will watch your condition.  Will get help right  away if you are not doing well or get worse. Document Released: 06/09/2007 Document Revised: 03/15/2011 Document Reviewed: 03/28/2013 [facility] Patient Information 2015 [place], [place]. This information is not intended to replace advice given to you by your health care provider. Make sure you discuss any questions you have with your health care provider.

## 2014-02-27 NOTE — ED Notes (Signed)
Pt a/o x 4 on d/c with steady gait. 

## 2014-03-13 ENCOUNTER — Encounter (HOSPITAL_COMMUNITY): Payer: BLUE CROSS/BLUE SHIELD

## 2014-03-15 ENCOUNTER — Telehealth: Payer: Self-pay | Admitting: Cardiology

## 2014-03-15 NOTE — Telephone Encounter (Signed)
Have called patient to set up myoview. Phone just ring off the hook.  Below is the times I have called.    02-27-14 NO ANS. WILL CALL BACK LATER/SAF 03-04-14 No Ans. phone just ring/saf 03-12-14 Still no ans.  /saf 03-15-14 still no ans.at home/note sent to nurse/saf

## 2014-03-15 NOTE — Telephone Encounter (Signed)
Continue to try to reach him and I will let Dr Myrtis SerKatz know when he returns.

## 2014-03-26 ENCOUNTER — Encounter: Payer: Self-pay | Admitting: Cardiology

## 2014-03-27 ENCOUNTER — Ambulatory Visit: Payer: BLUE CROSS/BLUE SHIELD | Admitting: Cardiology

## 2014-04-15 ENCOUNTER — Encounter: Payer: Self-pay | Admitting: Cardiology

## 2014-10-04 ENCOUNTER — Encounter (HOSPITAL_COMMUNITY)
Admission: EM | Disposition: A | Discharge status: Home / Self Care | Payer: Self-pay | Source: Home / Self Care | Attending: Interventional Cardiology

## 2014-10-04 ENCOUNTER — Emergency Department (HOSPITAL_COMMUNITY): Payer: BLUE CROSS/BLUE SHIELD

## 2014-10-04 ENCOUNTER — Ambulatory Visit (HOSPITAL_COMMUNITY): Admit: 2014-10-04 | Payer: BLUE CROSS/BLUE SHIELD | Admitting: Cardiovascular Disease

## 2014-10-04 ENCOUNTER — Inpatient Hospital Stay (HOSPITAL_COMMUNITY)
Admission: EM | Admit: 2014-10-04 | Discharge: 2014-10-05 | DRG: 247 | Disposition: A | Discharge status: Home / Self Care | Payer: BLUE CROSS/BLUE SHIELD | Attending: Interventional Cardiology | Admitting: Interventional Cardiology

## 2014-10-04 ENCOUNTER — Encounter (HOSPITAL_COMMUNITY): Payer: Self-pay | Admitting: Emergency Medicine

## 2014-10-04 DIAGNOSIS — Z885 Allergy status to narcotic agent status: Secondary | ICD-10-CM | POA: Diagnosis not present

## 2014-10-04 DIAGNOSIS — I200 Unstable angina: Secondary | ICD-10-CM

## 2014-10-04 DIAGNOSIS — Z87891 Personal history of nicotine dependence: Secondary | ICD-10-CM | POA: Diagnosis not present

## 2014-10-04 DIAGNOSIS — E876 Hypokalemia: Secondary | ICD-10-CM | POA: Diagnosis present

## 2014-10-04 DIAGNOSIS — Z7982 Long term (current) use of aspirin: Secondary | ICD-10-CM

## 2014-10-04 DIAGNOSIS — Z79899 Other long term (current) drug therapy: Secondary | ICD-10-CM | POA: Diagnosis not present

## 2014-10-04 DIAGNOSIS — I252 Old myocardial infarction: Secondary | ICD-10-CM | POA: Diagnosis not present

## 2014-10-04 DIAGNOSIS — T82855A Stenosis of coronary artery stent, initial encounter: Principal | ICD-10-CM | POA: Diagnosis present

## 2014-10-04 DIAGNOSIS — I2510 Atherosclerotic heart disease of native coronary artery without angina pectoris: Secondary | ICD-10-CM | POA: Diagnosis present

## 2014-10-04 DIAGNOSIS — E785 Hyperlipidemia, unspecified: Secondary | ICD-10-CM | POA: Diagnosis present

## 2014-10-04 DIAGNOSIS — R079 Chest pain, unspecified: Secondary | ICD-10-CM | POA: Diagnosis not present

## 2014-10-04 DIAGNOSIS — Z8249 Family history of ischemic heart disease and other diseases of the circulatory system: Secondary | ICD-10-CM | POA: Diagnosis not present

## 2014-10-04 DIAGNOSIS — I25110 Atherosclerotic heart disease of native coronary artery with unstable angina pectoris: Secondary | ICD-10-CM | POA: Diagnosis present

## 2014-10-04 DIAGNOSIS — I25118 Atherosclerotic heart disease of native coronary artery with other forms of angina pectoris: Secondary | ICD-10-CM | POA: Diagnosis present

## 2014-10-04 DIAGNOSIS — Y838 Other surgical procedures as the cause of abnormal reaction of the patient, or of later complication, without mention of misadventure at the time of the procedure: Secondary | ICD-10-CM

## 2014-10-04 DIAGNOSIS — F17200 Nicotine dependence, unspecified, uncomplicated: Secondary | ICD-10-CM | POA: Diagnosis present

## 2014-10-04 HISTORY — PX: CARDIAC CATHETERIZATION: SHX172

## 2014-10-04 HISTORY — DX: Pneumonia, unspecified organism: J18.9

## 2014-10-04 LAB — I-STAT CHEM 8, ED
BUN: 14 mg/dL (ref 6–20)
Calcium, Ion: 1.14 mmol/L (ref 1.12–1.23)
Chloride: 105 mmol/L (ref 101–111)
Creatinine, Ser: 0.8 mg/dL (ref 0.61–1.24)
Glucose, Bld: 117 mg/dL — ABNORMAL HIGH (ref 65–99)
HCT: 43 % (ref 39.0–52.0)
Hemoglobin: 14.6 g/dL (ref 13.0–17.0)
Potassium: 3.4 mmol/L — ABNORMAL LOW (ref 3.5–5.1)
Sodium: 142 mmol/L (ref 135–145)
TCO2: 23 mmol/L (ref 0–100)

## 2014-10-04 LAB — CBC
HCT: 42.4 % (ref 39.0–52.0)
Hemoglobin: 14.6 g/dL (ref 13.0–17.0)
MCH: 30.8 pg (ref 26.0–34.0)
MCHC: 34.4 g/dL (ref 30.0–36.0)
MCV: 89.5 fL (ref 78.0–100.0)
Platelets: 174 10*3/uL (ref 150–400)
RBC: 4.74 MIL/uL (ref 4.22–5.81)
RDW: 13.3 % (ref 11.5–15.5)
WBC: 5.8 10*3/uL (ref 4.0–10.5)

## 2014-10-04 LAB — BASIC METABOLIC PANEL WITH GFR
Anion gap: 6 (ref 5–15)
BUN: 16 mg/dL (ref 6–20)
CO2: 25 mmol/L (ref 22–32)
Calcium: 9 mg/dL (ref 8.9–10.3)
Chloride: 107 mmol/L (ref 101–111)
Creatinine, Ser: 0.85 mg/dL (ref 0.61–1.24)
GFR calc Af Amer: >60 mL/min
GFR calc non Af Amer: >60 mL/min
Glucose, Bld: 117 mg/dL — ABNORMAL HIGH (ref 65–99)
Potassium: 3.3 mmol/L — ABNORMAL LOW (ref 3.5–5.1)
Sodium: 138 mmol/L (ref 135–145)

## 2014-10-04 LAB — HEPATIC FUNCTION PANEL
ALT: 31 U/L (ref 17–63)
AST: 28 U/L (ref 15–41)
Albumin: 3.8 g/dL (ref 3.5–5.0)
Alkaline Phosphatase: 75 U/L (ref 38–126)
Bilirubin, Direct: 0.1 mg/dL (ref 0.1–0.5)
Indirect Bilirubin: 0.6 mg/dL (ref 0.3–0.9)
Total Bilirubin: 0.7 mg/dL (ref 0.3–1.2)
Total Protein: 7.1 g/dL (ref 6.5–8.1)

## 2014-10-04 LAB — PROTIME-INR
INR: 0.98 (ref 0.00–1.49)
Prothrombin Time: 13.2 seconds (ref 11.6–15.2)

## 2014-10-04 LAB — I-STAT TROPONIN, ED: Troponin i, poc: 0.02 ng/mL (ref 0.00–0.08)

## 2014-10-04 LAB — TROPONIN I: Troponin I: 0.08 ng/mL — ABNORMAL HIGH

## 2014-10-04 LAB — POCT ACTIVATED CLOTTING TIME
Activated Clotting Time: 245 seconds
Activated Clotting Time: 282 seconds

## 2014-10-04 LAB — CBG MONITORING, ED: Glucose-Capillary: 120 mg/dL — ABNORMAL HIGH (ref 65–99)

## 2014-10-04 SURGERY — LEFT HEART CATH AND CORONARY ANGIOGRAPHY

## 2014-10-04 SURGERY — LEFT HEART CATH AND CORONARY ANGIOGRAPHY
Anesthesia: LOCAL

## 2014-10-04 MED ORDER — FENTANYL CITRATE (PF) 100 MCG/2ML IJ SOLN
INTRAMUSCULAR | Status: DC | PRN
  Administered 2014-10-04: 25 ug via INTRAVENOUS

## 2014-10-04 MED ORDER — TIROFIBAN (AGGRASTAT) BOLUS VIA INFUSION
INTRAVENOUS | Status: DC | PRN
  Administered 2014-10-04: 2552.5 ug via INTRAVENOUS

## 2014-10-04 MED ORDER — VERAPAMIL HCL 2.5 MG/ML IV SOLN
INTRAVENOUS | Status: DC | PRN
  Administered 2014-10-04: 18:00:00 via INTRA_ARTERIAL

## 2014-10-04 MED ORDER — VERAPAMIL HCL 2.5 MG/ML IV SOLN
INTRAVENOUS | Status: AC
Start: 2014-10-04 — End: 2014-10-04
  Filled 2014-10-04: qty 2

## 2014-10-04 MED ORDER — TIROFIBAN HCL IV 12.5 MG/250 ML
INTRAVENOUS | Status: DC | PRN
  Administered 2014-10-04: .15 ug/kg/min via INTRAVENOUS

## 2014-10-04 MED ORDER — ONDANSETRON HCL 4 MG/2ML IJ SOLN: 4.0000 mg | Freq: Once | INTRAMUSCULAR | Status: DC

## 2014-10-04 MED ORDER — TIROFIBAN HCL IV 5 MG/100ML
INTRAVENOUS | Status: AC
  Filled 2014-10-04: qty 100

## 2014-10-04 MED ORDER — MORPHINE SULFATE (PF) 4 MG/ML IV SOLN
4.0000 mg | Freq: Once | INTRAVENOUS | Status: AC
  Administered 2014-10-04: 4 mg via INTRAVENOUS
  Filled 2014-10-04: qty 1

## 2014-10-04 MED ORDER — NITROGLYCERIN 2 % TD OINT
1.0000 [in_us] | TOPICAL_OINTMENT | Freq: Once | TRANSDERMAL | Status: AC
  Administered 2014-10-04: 1 [in_us] via TOPICAL
  Filled 2014-10-04: qty 1

## 2014-10-04 MED ORDER — SODIUM CHLORIDE 0.9 % IV SOLN: INTRAVENOUS | Status: AC

## 2014-10-04 MED ORDER — SODIUM CHLORIDE 0.9 % IV SOLN: 250.0000 mL | INTRAVENOUS | Status: DC | PRN

## 2014-10-04 MED ORDER — TIROFIBAN HCL IV 5 MG/100ML
0.1500 ug/kg/min | INTRAVENOUS | Status: AC
  Administered 2014-10-04: 0.15 ug/kg/min via INTRAVENOUS
  Filled 2014-10-04 (×2): qty 100

## 2014-10-04 MED ORDER — SODIUM CHLORIDE 0.9 % IJ SOLN
3.0000 mL | Freq: Two times a day (BID) | INTRAMUSCULAR | Status: DC
  Administered 2014-10-05: 10:00:00 3 mL via INTRAVENOUS

## 2014-10-04 MED ORDER — LIDOCAINE HCL (PF) 1 % IJ SOLN
INTRAMUSCULAR | Status: DC | PRN
  Administered 2014-10-04: 19:00:00

## 2014-10-04 MED ORDER — MORPHINE SULFATE (PF) 4 MG/ML IV SOLN: 4.0000 mg | Freq: Once | INTRAVENOUS | Status: DC

## 2014-10-04 MED ORDER — MIDAZOLAM HCL 2 MG/2ML IJ SOLN
INTRAMUSCULAR | Status: AC
  Filled 2014-10-04: qty 4

## 2014-10-04 MED ORDER — PRASUGREL HCL 10 MG PO TABS
ORAL_TABLET | ORAL | Status: AC
  Filled 2014-10-04: qty 6

## 2014-10-04 MED ORDER — HEPARIN (PORCINE) IN NACL 2-0.9 UNIT/ML-% IJ SOLN
INTRAMUSCULAR | Status: AC
  Filled 2014-10-04: qty 500

## 2014-10-04 MED ORDER — LIDOCAINE HCL (PF) 1 % IJ SOLN
INTRAMUSCULAR | Status: AC
  Filled 2014-10-04: qty 30

## 2014-10-04 MED ORDER — ATORVASTATIN CALCIUM 80 MG PO TABS: 80.0000 mg | ORAL_TABLET | Freq: Every day | ORAL | Status: DC

## 2014-10-04 MED ORDER — NITROGLYCERIN 1 MG/10 ML FOR IR/CATH LAB
INTRA_ARTERIAL | Status: AC
  Filled 2014-10-04: qty 10

## 2014-10-04 MED ORDER — ONDANSETRON HCL 4 MG/2ML IJ SOLN
4.0000 mg | Freq: Once | INTRAMUSCULAR | Status: AC
  Administered 2014-10-04: 4 mg via INTRAVENOUS

## 2014-10-04 MED ORDER — PRASUGREL HCL 10 MG PO TABS
30.0000 mg | ORAL_TABLET | Freq: Once | ORAL | Status: AC
  Administered 2014-10-04: 30 mg via ORAL
  Filled 2014-10-04: qty 3

## 2014-10-04 MED ORDER — HEPARIN SODIUM (PORCINE) 1000 UNIT/ML IJ SOLN
INTRAMUSCULAR | Status: AC
  Filled 2014-10-04: qty 1

## 2014-10-04 MED ORDER — ONDANSETRON HCL 4 MG/2ML IJ SOLN
4.0000 mg | Freq: Once | INTRAMUSCULAR | Status: DC
  Filled 2014-10-04: qty 2

## 2014-10-04 MED ORDER — FENTANYL CITRATE (PF) 100 MCG/2ML IJ SOLN
INTRAMUSCULAR | Status: AC
  Filled 2014-10-04: qty 4

## 2014-10-04 MED ORDER — SODIUM CHLORIDE 0.9 % IV SOLN
INTRAVENOUS | Status: DC | PRN
  Administered 2014-10-04: 50 mL/h via INTRAVENOUS

## 2014-10-04 MED ORDER — NITROGLYCERIN 1 MG/10 ML FOR IR/CATH LAB
INTRA_ARTERIAL | Status: DC | PRN
  Administered 2014-10-04: 200 ug via INTRACORONARY

## 2014-10-04 MED ORDER — ONDANSETRON HCL 4 MG/2ML IJ SOLN
INTRAMUSCULAR | Status: DC | PRN
  Administered 2014-10-04: 4 mg via INTRAVENOUS

## 2014-10-04 MED ORDER — NITROGLYCERIN 0.4 MG SL SUBL
0.4000 mg | SUBLINGUAL_TABLET | Freq: Once | SUBLINGUAL | Status: AC
  Administered 2014-10-04: 0.4 mg via SUBLINGUAL
  Filled 2014-10-04: qty 1

## 2014-10-04 MED ORDER — HEPARIN (PORCINE) IN NACL 2-0.9 UNIT/ML-% IJ SOLN
INTRAMUSCULAR | Status: AC
  Filled 2014-10-04: qty 1000

## 2014-10-04 MED ORDER — PRASUGREL HCL 10 MG PO TABS
10.0000 mg | ORAL_TABLET | Freq: Every day | ORAL | Status: DC
  Administered 2014-10-05: 10:00:00 10 mg via ORAL
  Filled 2014-10-04: qty 1

## 2014-10-04 MED ORDER — NITROGLYCERIN 0.4 MG SL SUBL: 0.4000 mg | SUBLINGUAL_TABLET | SUBLINGUAL | Status: DC | PRN

## 2014-10-04 MED ORDER — PRASUGREL HCL 10 MG PO TABS
ORAL_TABLET | ORAL | Status: DC | PRN
  Administered 2014-10-04: 60 mg via ORAL

## 2014-10-04 MED ORDER — ONDANSETRON HCL 4 MG/2ML IJ SOLN
INTRAMUSCULAR | Status: AC
  Filled 2014-10-04: qty 2

## 2014-10-04 MED ORDER — SODIUM CHLORIDE 0.9 % IJ SOLN: 3.0000 mL | INTRAMUSCULAR | Status: DC | PRN

## 2014-10-04 MED ORDER — HEPARIN (PORCINE) IN NACL 100-0.45 UNIT/ML-% IJ SOLN
1300.0000 [IU]/h | INTRAMUSCULAR | Status: DC
  Administered 2014-10-04: 1300 [IU]/h via INTRAVENOUS
  Filled 2014-10-04: qty 250

## 2014-10-04 MED ORDER — ACETAMINOPHEN 325 MG PO TABS: 650.0000 mg | ORAL_TABLET | ORAL | Status: DC | PRN

## 2014-10-04 MED ORDER — NITROGLYCERIN IN D5W 200-5 MCG/ML-% IV SOLN
0.0000 ug/min | INTRAVENOUS | Status: DC
  Administered 2014-10-04: 5 ug/min via INTRAVENOUS
  Filled 2014-10-04: qty 250

## 2014-10-04 MED ORDER — NITROGLYCERIN IN D5W 200-5 MCG/ML-% IV SOLN
INTRAVENOUS | Status: DC | PRN
  Administered 2014-10-04: 10 ug/min via INTRAVENOUS

## 2014-10-04 MED ORDER — IOHEXOL 350 MG/ML SOLN
INTRAVENOUS | Status: DC | PRN
  Administered 2014-10-04: 110 mL via INTRA_ARTERIAL

## 2014-10-04 MED ORDER — METOPROLOL TARTRATE 25 MG PO TABS
25.0000 mg | ORAL_TABLET | Freq: Two times a day (BID) | ORAL | Status: DC
  Administered 2014-10-04 – 2014-10-05 (×2): 25 mg via ORAL
  Filled 2014-10-04 (×2): qty 1

## 2014-10-04 MED ORDER — MIDAZOLAM HCL 2 MG/2ML IJ SOLN
INTRAMUSCULAR | Status: DC | PRN
  Administered 2014-10-04: 2 mg via INTRAVENOUS

## 2014-10-04 MED ORDER — ASPIRIN 81 MG PO CHEW
81.0000 mg | CHEWABLE_TABLET | Freq: Every day | ORAL | Status: DC
  Administered 2014-10-05: 10:00:00 81 mg via ORAL
  Filled 2014-10-04: qty 1

## 2014-10-04 MED ORDER — HEPARIN BOLUS VIA INFUSION
4000.0000 [IU] | Freq: Once | INTRAVENOUS | Status: AC
  Administered 2014-10-04: 4000 [IU] via INTRAVENOUS

## 2014-10-04 MED ORDER — HEPARIN SODIUM (PORCINE) 1000 UNIT/ML IJ SOLN
INTRAMUSCULAR | Status: DC | PRN
  Administered 2014-10-04 (×2): 5000 [IU] via INTRAVENOUS
  Administered 2014-10-04: 3000 [IU] via INTRAVENOUS

## 2014-10-04 MED ORDER — ONDANSETRON HCL 4 MG/2ML IJ SOLN: 4.0000 mg | Freq: Four times a day (QID) | INTRAMUSCULAR | Status: DC | PRN

## 2014-10-04 SURGICAL SUPPLY — 19 items
BALLN ~~LOC~~ EMERGE MR 4.0X20 (BALLOONS) ×2
BALLN ~~LOC~~ EUPHORA RX 3.0X15 (BALLOONS) ×2
BALLOON ~~LOC~~ EMERGE MR 4.0X20 (BALLOONS) ×1 IMPLANT
BALLOON ~~LOC~~ EUPHORA RX 3.0X15 (BALLOONS) ×1 IMPLANT
CATH INFINITI 5 FR JL3.5 (CATHETERS) ×2 IMPLANT
CATH INFINITI 5FR ANG PIGTAIL (CATHETERS) ×2 IMPLANT
CATH INFINITI JR4 5F (CATHETERS) ×2 IMPLANT
CATH VISTA GUIDE 6FR JR4 (CATHETERS) ×2 IMPLANT
DEVICE RAD COMP TR BAND LRG (VASCULAR PRODUCTS) ×2 IMPLANT
GLIDESHEATH SLEND SS 6F .021 (SHEATH) ×2 IMPLANT
KIT ENCORE 26 ADVANTAGE (KITS) ×2 IMPLANT
KIT HEART LEFT (KITS) ×2 IMPLANT
PACK CARDIAC CATHETERIZATION (CUSTOM PROCEDURE TRAY) ×2 IMPLANT
STENT PROMUS PREM MR 3.5X24 (Permanent Stent) ×2 IMPLANT
SYR MEDRAD MARK V 150ML (SYRINGE) ×2 IMPLANT
TRANSDUCER W/STOPCOCK (MISCELLANEOUS) ×2 IMPLANT
TUBING CIL FLEX 10 FLL-RA (TUBING) ×2 IMPLANT
WIRE COUGAR XT STRL 190CM (WIRE) ×2 IMPLANT
WIRE SAFE-T 1.5MM-J .035X260CM (WIRE) ×2 IMPLANT

## 2014-10-04 NOTE — ED Notes (Signed)
Pt reports onset of chest pain this am with SOB, diaphoresis and lightheadedness. Given 2 SL nitro and 325 ASA en route.

## 2014-10-04 NOTE — Consult Note (Signed)
Primary cardiologist: Dr Jerral Bonito Consulting cardiologist: Dr Dina Rich MD  Clinical Summary Dennis Terry is a 49 y.o.male history of CAD with NSTEMI 12/2008 treated with DES to RCA, HL, tobacco abuse, admitted with chest pain. Last cath in 2012 with patient vessels. Seen in ER 02/2014 with chest pain, negative EKG and enzymes, discharged with plans for outpatient stress test but does not appear it was completed - presents today with chest pain starting around 730 AM. 7/10 pressure in midchest with associated SOB and severe diaphoresis that started while he was walking this AM. Symptoms somewhat better with rest. Symptoms last approx 15 minutes, but has had multiple episodes throughout the day that are increasing in duration and severity. Better with SL NG. Reports exactly like his pain in 2010 when he had his MI.    ER vitals: p 62 bp 115/68 100% RA Cath 2012: LM normal, LAD mid 20%, LCX patent, RCA patent stent with 30% ISR.  Hgb 14.6, Plt 174, trop neg x1, K 3.4, Cr 0.80 EKG NSR CXR no acute process   Allergies  Allergen Reactions  . Oxycodone-Acetaminophen Shortness Of Breath and Other (See Comments)    Chest tightness and numbness     Medications Scheduled Medications:     Infusions:     PRN Medications:  nitroGLYCERIN   Past Medical History  Diagnosis Date  . Myocardial infarction   . CAD (coronary artery disease), native coronary artery   . Hyperlipidemia     Past Surgical History  Procedure Laterality Date  . Coronary angioplasty with stent placement      Family History  Problem Relation Age of Onset  . Heart failure Father   . Cancer Other     Social History Dennis Terry reports that he has quit smoking. He has never used smokeless tobacco. Dennis Terry reports that he does not drink alcohol.  Review of Systems CONSTITUTIONAL: No weight loss, fever, chills, weakness or fatigue.  HEENT: Eyes: No visual loss, blurred vision, double vision or yellow  sclerae. No hearing loss, sneezing, congestion, runny nose or sore throat.  SKIN: No rash or itching.  CARDIOVASCULAR: per HPI RESPIRATORY: No shortness of breath, cough or sputum.  GASTROINTESTINAL: No anorexia, nausea, vomiting or diarrhea. No abdominal pain or blood.  GENITOURINARY: no polyuria, no dysuria NEUROLOGICAL: No headache, dizziness, syncope, paralysis, ataxia, numbness or tingling in the extremities. No change in bowel or bladder control.  MUSCULOSKELETAL: No muscle, back pain, joint pain or stiffness.  HEMATOLOGIC: No anemia, bleeding or bruising.  LYMPHATICS: No enlarged nodes. No history of splenectomy.  PSYCHIATRIC: No history of depression or anxiety.      Physical Examination Blood pressure 126/82, pulse 61, temperature 97.7 F (36.5 C), resp. rate 17, height  (1.753 m), weight 225 lb (102.059 kg), SpO2 100 %. No intake or output data in the 24 hours ending 10/04/14 1405  HEENT: sclera clear  Cardiovascular: RRR, no m/r/g, no JVD  Respiratory: CTAB  GI: abdomen soft, NT, ND  MSK: no LE edema  Neuro: no focal deficits  Psych: appropriate affect   Lab Results  Basic Metabolic Panel:  Recent Labs Lab 10/04/14 1323  NA 142  K 3.4*  CL 105  GLUCOSE 117*  BUN 14  CREATININE 0.80    Liver Function Tests: No results for input(s): AST, ALT, ALKPHOS, BILITOT, PROT, ALBUMIN in the last 168 hours.  CBC:  Recent Labs Lab 10/04/14 1316 10/04/14 1323  WBC 5.8  --  HGB 14.6 14.6  HCT 42.4 43.0  MCV 89.5  --   PLT 174  --     Cardiac Enzymes: No results for input(s): CKTOTAL, CKMB, CKMBINDEX, TROPONINI in the last 168 hours.  BNP: Invalid input(s): POCBNP    Impression/Recommendations 1. Chest pain - known history of CAD with stent to RCA in 2010 - subjectively symptoms are very worrisome for progressing ischemia/angina. He states that they are exactly like his prior symptoms in 2010 when he had his MI. Objectively his EKG and  troponins thus far are benign. Concern that symptoms are accelerating, some response with NG but no resolution. Will start hep gtt and nitro gtt.  - he and his family are very concerned and are strongly in favor of transfer to [facility] with cath today. I think given his history, strength of his story, and accelerating symptoms  this is a good plan of action despite the benign findings thus far of objective tests.  - will plan transfer to [facility] today for cath.   Dina Rich, M.D.

## 2014-10-04 NOTE — Progress Notes (Signed)
ANTICOAGULATION CONSULT NOTE - Initial Consult  Pharmacy Consult for Heparin Indication: chest pain/ACS  Allergies  Allergen Reactions  . Oxycodone-Acetaminophen Shortness Of Breath and Other (See Comments)    Chest tightness and numbness     Patient Measurements: Height:  (175.3 cm) Weight: 225 lb (102.059 kg) IBW/kg (Calculated) : 70.7 Heparin Dosing Weight: 92.7 kg  Vital Signs: Temp: 97.7 F (36.5 C) (09/30 1259) BP: 115/68 mmHg (09/30 1400) Pulse Rate: 62 (09/30 1400)  Labs:  Recent Labs  10/04/14 1316 10/04/14 1323  HGB 14.6 14.6  HCT 42.4 43.0  PLT 174  --   CREATININE 0.85 0.80  TROPONINI 0.08*  --     Estimated Creatinine Clearance: 130.2 mL/min (by C-G formula based on Cr of 0.8).   Medical History: Past Medical History  Diagnosis Date  . Myocardial infarction   . CAD (coronary artery disease), native coronary artery   . Hyperlipidemia      Assessment: 49 yo male ED patient, chest pain History of CAD with stent to RCA in 2010 Plan transfer to [facility] for cath Labs reviewed PTA medications reviewed  Goal of Therapy:  Heparin level 0.3-0.7 units/ml Monitor platelets by anticoagulation protocol: Yes   Plan:  Give 4000 units bolus x 1 Start heparin infusion at 1300 units/hr Check anti-Xa level in 6 hours and daily while on heparin Continue to monitor H&H and platelets  Raquel James, Person Bennett 10/04/2014,2:52 PM

## 2014-10-04 NOTE — ED Notes (Signed)
Nitro paste dc'd

## 2014-10-04 NOTE — ED Provider Notes (Signed)
CSN: [id number]     Arrival date & time 10/04/14  1251 History   First MD Initiated Contact with Patient 10/04/14 1302     Chief Complaint  Patient presents with  . Chest Pain     (Consider location/radiation/quality/duration/timing/severity/associated sxs/prior Treatment) Patient is a 49 y.o. male presenting with chest pain. The history is provided by the patient (The patient states that he started having chest pain off and on since 5 AM this morning. He has not had this pain since he had his MI in 2010.).  Chest Pain Pain location:  Substernal area Pain quality: aching   Pain radiates to:  Does not radiate Pain severity:  Moderate Onset quality:  Sudden Timing:  Intermittent Chronicity:  New Context: breathing   Associated symptoms: no abdominal pain, no back pain, no cough, no fatigue and no headache     Past Medical History  Diagnosis Date  . Myocardial infarction   . CAD (coronary artery disease), native coronary artery   . Hyperlipidemia    Past Surgical History  Procedure Laterality Date  . Coronary angioplasty with stent placement     Family History  Problem Relation Age of Onset  . Heart failure Father   . Cancer Other    Social History  Substance Use Topics  . Smoking status: Former Games developer  . Smokeless tobacco: Never Used  . Alcohol Use: No    Review of Systems  Constitutional: Negative for appetite change and fatigue.  HENT: Negative for congestion, ear discharge and sinus pressure.   Eyes: Negative for discharge.  Respiratory: Negative for cough.   Cardiovascular: Positive for chest pain.  Gastrointestinal: Negative for abdominal pain and diarrhea.  Genitourinary: Negative for frequency and hematuria.  Musculoskeletal: Negative for back pain.  Skin: Negative for rash.  Neurological: Negative for seizures and headaches.  Psychiatric/Behavioral: Negative for hallucinations.      Allergies  Oxycodone-acetaminophen  Home Medications   Prior to  Admission medications   Medication Sig Start Date End Date Taking? Authorizing Provider  aspirin EC 325 MG tablet Take 325 mg by mouth daily.    Historical Provider, MD  NITROSTAT 0.4 MG SL tablet DISSOLVE ONE TABLET UNDER THE TONGUE EVERY 5 MINUTES AS NEEDED FOR CHEST PAIN.  DO NOT EXCEED A TOTAL OF 3 DOSES IN 15 MINUTES 05/18/11   Rhonda G Barrett, PA-C   BP 115/68 mmHg  Pulse 62  Temp(Src) 97.7 F (36.5 C)  Resp 12  Ht  (1.753 m)  Wt 225 lb (102.059 kg)  BMI 33.21 kg/m2  SpO2 100% Physical Exam  Constitutional: He is oriented to person, place, and time. He appears well-developed.  HENT:  Head: Normocephalic.  Eyes: Conjunctivae and EOM are normal. No scleral icterus.  Neck: Neck supple. No thyromegaly present.  Cardiovascular: Normal rate and regular rhythm.  Exam reveals no gallop and no friction rub.   No murmur heard. Pulmonary/Chest: No stridor. He has no wheezes. He has no rales. He exhibits no tenderness.  Abdominal: He exhibits no distension. There is no tenderness. There is no rebound.  Musculoskeletal: Normal range of motion. He exhibits no edema.  Lymphadenopathy:    He has no cervical adenopathy.  Neurological: He is oriented to person, place, and time. He exhibits normal muscle tone. Coordination normal.  Skin: No rash noted. No erythema.  Psychiatric: He has a normal mood and affect. His behavior is normal.    ED Course  Procedures (including critical care time) Labs Review Labs Reviewed  BASIC METABOLIC PANEL - Abnormal; Notable for the following:    Potassium 3.3 (*)    Glucose, Bld 117 (*)    All other components within normal limits  TROPONIN I - Abnormal; Notable for the following:    Troponin I 0.08 (*)    All other components within normal limits  I-STAT CHEM 8, ED - Abnormal; Notable for the following:    Potassium 3.4 (*)    Glucose, Bld 117 (*)    All other components within normal limits  CBG MONITORING, ED - Abnormal; Notable for the  following:    Glucose-Capillary 120 (*)    All other components within normal limits  CBC  HEPATIC FUNCTION PANEL  I-STAT TROPOININ, ED    Imaging Review Dg Chest Portable 1 View  10/04/2014   CLINICAL DATA:  Acute onset chest pain and shortness of breath for 3 hours. Myocardial infarct.  EXAM: PORTABLE CHEST 1 VIEW  COMPARISON:  None.  FINDINGS: The heart size and mediastinal contours are within normal limits. Both lungs are clear. No evidence of pneumothorax or pleural effusion.  IMPRESSION: No active disease.   Electronically Signed   By: Myles Rosenthal M.D.   On: 10/04/2014 13:34   I have personally reviewed and evaluated these images and lab results as part of my medical decision-making.   EKG Interpretation   Date/Time:  Friday October 04 2014 12:55:40 EDT Ventricular Rate:  61 PR Interval:  176 QRS Duration: 96 QT Interval:  418 QTC Calculation: 420 R Axis:   -4 Text Interpretation:  Normal sinus rhythm Normal ECG Confirmed by ZAMMIT   MD, JOSEPH [phone]) on 10/04/2014 1:12:37 PM     CRITICAL CARE Performed by: ZAMMIT,JOSEPH L Total critical care time:35 Critical care time was exclusive of separately billable procedures and treating other patients. Critical care was necessary to treat or prevent imminent or life-threatening deterioration. Critical care was time spent personally by me on the following activities: development of treatment plan with patient and/or surrogate as well as nursing, discussions with consultants, evaluation of patient's response to treatment, examination of patient, obtaining history from patient or surrogate, ordering and performing treatments and interventions, ordering and review of laboratory studies, ordering and review of radiographic studies, pulse oximetry and re-evaluation of patient's condition.   MDM   Final diagnoses:  Chest pain at rest    Patient seen by cardiology. It was decided [facility], put him on heparin  and nitroglycerin. The patient may have a cardiac cath today.    Bethann Berkshire, MD 10/04/14 [phone]

## 2014-10-04 NOTE — ED Notes (Signed)
Cardiologist at bedside.  

## 2014-10-04 NOTE — Interval H&P Note (Signed)
Cath Lab Visit (complete for each Cath Lab visit)  Clinical Evaluation Leading to the Procedure:   ACS: Yes.    Non-ACS:    Anginal Classification: CCS IV  Anti-ischemic medical therapy: No Therapy  Non-Invasive Test Results: No non-invasive testing performed  Prior CABG: No previous CABG      History and Physical Interval Note:  10/04/2014 5:42 PM  Dennis Terry  has presented today for surgery, with the diagnosis of cp  The various methods of treatment have been discussed with the patient and family. After consideration of risks, benefits and other options for treatment, the patient has consented to  Procedure(s): Left Heart Cath and Coronary Angiography (N/A) as a surgical intervention .  The patient's history has been reviewed, patient examined, no change in status, stable for surgery.  I have reviewed the patient's chart and labs.  Questions were answered to the patient's satisfaction.     Tonny Bollman

## 2014-10-04 NOTE — H&P (View-Only) (Signed)
  Primary cardiologist: Dr Jeff Katz Consulting cardiologist: Dr Jonathan Branch MD  Clinical Summary Dennis Terry is a 49 y.o.male history of CAD with NSTEMI 12/2008 treated with DES to RCA, HL, tobacco abuse, admitted with chest pain. Last cath in 2012 with patient vessels. Seen in ER 02/2014 with chest pain, negative EKG and enzymes, discharged with plans for outpatient stress test but does not appear it was completed - presents today with chest pain starting around 730 AM. 7/10 pressure in midchest with associated SOB and severe diaphoresis that started while he was walking this AM. Symptoms somewhat better with rest. Symptoms last approx 15 minutes, but has had multiple episodes throughout the day that are increasing in duration and severity. Better with SL NG. Reports exactly like his pain in 2010 when he had his MI.    ER vitals: p 62 bp 115/68 100% RA Cath 2012: LM normal, LAD mid 20%, LCX patent, RCA patent stent with 30% ISR.  Hgb 14.6, Plt 174, trop neg x1, K 3.4, Cr 0.80 EKG NSR CXR no acute process   Allergies  Allergen Reactions  . Oxycodone-Acetaminophen Shortness Of Breath and Other (See Comments)    Chest tightness and numbness     Medications Scheduled Medications:     Infusions:     PRN Medications:  nitroGLYCERIN   Past Medical History  Diagnosis Date  . Myocardial infarction   . CAD (coronary artery disease), native coronary artery   . Hyperlipidemia     Past Surgical History  Procedure Laterality Date  . Coronary angioplasty with stent placement      Family History  Problem Relation Age of Onset  . Heart failure Father   . Cancer Other     Social History [name] reports that he has quit smoking. He has never used smokeless tobacco. [name] reports that he does not drink alcohol.  Review of Systems CONSTITUTIONAL: No weight loss, fever, chills, weakness or fatigue.  HEENT: Eyes: No visual loss, blurred vision, double vision or yellow  sclerae. No hearing loss, sneezing, congestion, runny nose or sore throat.  SKIN: No rash or itching.  CARDIOVASCULAR: per HPI RESPIRATORY: No shortness of breath, cough or sputum.  GASTROINTESTINAL: No anorexia, nausea, vomiting or diarrhea. No abdominal pain or blood.  GENITOURINARY: no polyuria, no dysuria NEUROLOGICAL: No headache, dizziness, syncope, paralysis, ataxia, numbness or tingling in the extremities. No change in bowel or bladder control.  MUSCULOSKELETAL: No muscle, back pain, joint pain or stiffness.  HEMATOLOGIC: No anemia, bleeding or bruising.  LYMPHATICS: No enlarged nodes. No history of splenectomy.  PSYCHIATRIC: No history of depression or anxiety.      Physical Examination Blood pressure 126/82, pulse 61, temperature 97.7 F (36.5 C), resp. rate 17, height 5' 9" (1.753 m), weight 225 lb (102.059 kg), SpO2 100 %. No intake or output data in the 24 hours ending 10/04/14 1405  HEENT: sclera clear  Cardiovascular: RRR, no m/r/g, no JVD  Respiratory: CTAB  GI: abdomen soft, NT, ND  MSK: no LE edema  Neuro: no focal deficits  Psych: appropriate affect   Lab Results  Basic Metabolic Panel:  Recent Labs Lab 10/04/14 1323  NA 142  K 3.4*  CL 105  GLUCOSE 117*  BUN 14  CREATININE 0.80    Liver Function Tests: No results for input(s): AST, ALT, ALKPHOS, BILITOT, PROT, ALBUMIN in the last 168 hours.  CBC:  Recent Labs Lab 10/04/14 1316 10/04/14 1323  WBC 5.8  --     HGB 14.6 14.6  HCT 42.4 43.0  MCV 89.5  --   PLT 174  --     Cardiac Enzymes: No results for input(s): CKTOTAL, CKMB, CKMBINDEX, TROPONINI in the last 168 hours.  BNP: Invalid input(s): POCBNP    Impression/Recommendations 1. Chest pain - known history of CAD with stent to RCA in 2010 - subjectively symptoms are very worrisome for progressing ischemia/angina. He states that they are exactly like his prior symptoms in 2010 when he had his MI. Objectively his EKG and  troponins thus far are benign. Concern that symptoms are accelerating, some response with NG but no resolution. Will start hep gtt and nitro gtt.  - he and his family are very concerned and are strongly in favor of transfer to [facility] with cath today. I think given his history, strength of his story, and accelerating symptoms  this is a good plan of action despite the benign findings thus far of objective tests.  - will plan transfer to [facility] today for cath.   Jonathan Branch, M.D.  

## 2014-10-04 NOTE — ED Notes (Signed)
Lebaur consulted for Dr. Estell Harpin. Dr. Wyline Mood paged out.

## 2014-10-04 NOTE — ED Notes (Signed)
Pt undressed in gown, belongings sent with fiance Larita Fife.

## 2014-10-05 ENCOUNTER — Encounter (HOSPITAL_COMMUNITY): Payer: Self-pay | Admitting: Cardiovascular Disease

## 2014-10-05 LAB — CBC
HCT: 42.8 % (ref 39.0–52.0)
Hemoglobin: 14.3 g/dL (ref 13.0–17.0)
MCH: 30 pg (ref 26.0–34.0)
MCHC: 33.4 g/dL (ref 30.0–36.0)
MCV: 89.7 fL (ref 78.0–100.0)
Platelets: 195 10*3/uL (ref 150–400)
RBC: 4.77 MIL/uL (ref 4.22–5.81)
RDW: 13.6 % (ref 11.5–15.5)
WBC: 7.7 10*3/uL (ref 4.0–10.5)

## 2014-10-05 LAB — BASIC METABOLIC PANEL
Anion gap: 9 (ref 5–15)
BUN: 11 mg/dL (ref 6–20)
CO2: 26 mmol/L (ref 22–32)
Calcium: 9.1 mg/dL (ref 8.9–10.3)
Chloride: 102 mmol/L (ref 101–111)
Creatinine, Ser: 0.81 mg/dL (ref 0.61–1.24)
GFR calc Af Amer: >60 mL/min (ref >60)
GFR calc non Af Amer: >60 mL/min (ref >60)
Glucose, Bld: 135 mg/dL — ABNORMAL HIGH (ref 65–99)
Potassium: 3.4 mmol/L — ABNORMAL LOW (ref 3.5–5.1)
Sodium: 137 mmol/L (ref 135–145)

## 2014-10-05 LAB — PLATELET COUNT: Platelets: 197 10*3/uL (ref 150–400)

## 2014-10-05 MED ORDER — POTASSIUM CHLORIDE CRYS ER 20 MEQ PO TBCR
40.0000 meq | EXTENDED_RELEASE_TABLET | Freq: Once | ORAL | Status: AC
  Administered 2014-10-05: 10:00:00 40 meq via ORAL
  Filled 2014-10-05: qty 2

## 2014-10-05 MED ORDER — ASPIRIN 81 MG PO CHEW: 81.0000 mg | CHEWABLE_TABLET | Freq: Every day | ORAL | Status: AC

## 2014-10-05 MED ORDER — SIMVASTATIN 20 MG PO TABS: 20.0000 mg | ORAL_TABLET | Freq: Every day | ORAL | Status: DC

## 2014-10-05 MED ORDER — PRASUGREL HCL 10 MG PO TABS: 10.0000 mg | ORAL_TABLET | Freq: Every day | ORAL | Status: DC

## 2014-10-05 MED ORDER — METOPROLOL TARTRATE 25 MG PO TABS: 25.0000 mg | ORAL_TABLET | Freq: Two times a day (BID) | ORAL | Status: DC

## 2014-10-05 MED ORDER — ATORVASTATIN CALCIUM 80 MG PO TABS: 80.0000 mg | ORAL_TABLET | Freq: Every day | ORAL | Status: DC

## 2014-10-05 NOTE — Discharge Summary (Signed)
Physician Discharge Summary     Cardiologist:  Branch  Patient ID: Dennis Terry MRN: [id number] DOB/AGE: 50/29/1967 50 y.o.  Admit date: 10/04/2014 Discharge date: 10/05/2014  Admission Diagnoses:  Unstable angina  Discharge Diagnoses:  Active Problems:   Hyperlipidemia   TOBACCO ABUSE   CAD, NATIVE VESSEL   Chest pain   Unstable angina   hypokalemia  Discharged Condition: stable  Hospital Course:   Dennis Terry is a 49 y.o.male history of CAD with NSTEMI 12/2008 treated with DES to RCA, HL, tobacco abuse, admitted with chest pain. Last cath in 2012 with patient vessels. Seen in ER 02/2014 with chest pain, negative EKG and enzymes, discharged with plans for outpatient stress test but does not appear it was completed.  He presented with chest pain starting around 730 AM. 7/10 pressure in midchest with associated SOB and severe diaphoresis that started while he was walking this AM. Symptoms somewhat better with rest. Symptoms last approx 15 minutes, but has had multiple episodes throughout the day that are increasing in duration and severity. Better with SL NG. Reports exactly like his pain in 2010 when he had his MI.   Patient was admitted. Initial troponin was 0.08. He underwent left heart catheterization revealing severe single vessel CAD involving the RCA which was treated successfully with PTCA and stenting (DES). Mild nonobstructive disease of the LAD (40%). Widely patent LCx. Preserved overall LV function. Patient is on aspirin and Effient. Also on Lipitor 80 mg and metoprolol 25 twice a day.  Blood pressure and heart rate controlled. Replace K.  The patient was seen by Dr. Johney Frame who felt he was stable for DC home.  Check LFTs and lipid panel in 6 weeks.  Consults: Cardiac rehabilitation  Significant Diagnostic Studies:  Left heart catheterization Conclusion     Mid RCA lesion, 90% stenosed.  Mid LAD lesion, 40% stenosed.  The left ventricular systolic function is  normal.  Prox RCA lesion, 60% stenosed. There is a 0% residual stenosis post intervention. The lesion was previously treated with a stent (unknown type) .  A drug-eluting stent was placed.  1. Severe single vessel CAD involving the RCA treated successfully with PTCA and stenting (DES) 2. Mild nonobstructive disease of the LAD 3. Widely patent LCx 4. Preserved overall LV function  Continue DAPT with ASA and Effient x 12 months as tolerated   Coronary Diagrams    Diagnostic Diagram           Post-Intervention Diagram              Treatments: see above  Discharge Exam: Blood pressure 154/94, pulse 67, temperature 98 F (36.7 C), temperature source Oral, resp. rate 16, height  (1.753 m), weight 226 lb 3.1 oz (102.6 kg), SpO2 95 %.   Disposition: 01-Home or Self Care      Discharge Instructions    Diet - low sodium heart healthy    Complete by:  As directed      Discharge instructions    Complete by:  As directed   No lifting with right arm for 3 days     Increase activity slowly    Complete by:  As directed             Medication List    STOP taking these medications        aspirin EC 325 MG tablet  Replaced by:  aspirin 81 MG chewable tablet      TAKE these medications  aspirin 81 MG chewable tablet  Chew 1 tablet (81 mg total) by mouth daily.     atorvastatin 80 MG tablet  Commonly known as:  LIPITOR  Take 1 tablet (80 mg total) by mouth daily at 6 PM.     metoprolol tartrate 25 MG tablet  Commonly known as:  LOPRESSOR  Take 1 tablet (25 mg total) by mouth 2 (two) times daily.     NITROSTAT 0.4 MG SL tablet  Generic drug:  nitroGLYCERIN  DISSOLVE ONE TABLET UNDER THE TONGUE EVERY 5 MINUTES AS NEEDED FOR CHEST PAIN.  DO NOT EXCEED A TOTAL OF 3 DOSES IN 15 MINUTES     prasugrel 10 MG Tabs tablet  Commonly known as:  EFFIENT  Take 1 tablet (10 mg total) by mouth daily.       Follow-up Information    Follow up with Dina Rich, MD.   Specialty:  Cardiology   Why:  office will call you with your follow up appointment date and time   Contact information:   [address] [place] [address] [phone]      Greater than 30 minutes was spent completing the patient's discharge.    SignedWilburt Finlay, PAC 10/05/2014, 9:34 AM

## 2014-10-05 NOTE — Progress Notes (Signed)
    Subjective: Patient reports feeling a lot better  Objective: Vital signs in last 24 hours: Temp:  [97.7 F (36.5 C)-98.2 F (36.8 C)] 98.2 F (36.8 C) (10/01 0341) Pulse Rate:  [0-89] 72 (10/01 0341) Resp:  [0-65] 15 (10/01 0341) BP: (104-161)/(63-107) 128/71 mmHg (10/01 0341) SpO2:  [0 %-100 %] 94 % (10/01 0341) Weight:  [225 lb (102.059 kg)-226 lb 3.1 oz (102.6 kg)] 226 lb 3.1 oz (102.6 kg) (10/01 0341) Last BM Date: 10/04/14  Intake/Output from previous day: 09/30 0701 - 10/01 0700 In: 690.4 [P.O.:180; I.V.:510.4] Out: 1000 [Urine:1000] Intake/Output this shift:    Medications Scheduled Meds: . aspirin  81 mg Oral Daily  . atorvastatin  80 mg Oral q1800  . metoprolol tartrate  25 mg Oral BID  . prasugrel  10 mg Oral Daily  . sodium chloride  3 mL Intravenous Q12H   Continuous Infusions:  PRN Meds:.sodium chloride, acetaminophen, ondansetron (ZOFRAN) IV, sodium chloride  PE: General appearance: alert, cooperative and no distress Lungs: clear to auscultation bilaterally Heart: regular rate and rhythm, S1, S2 normal, no murmur, click, rub or gallop Extremities: No lower extremity edema Pulses: 2+ and symmetric Skin: Warm and dry. Right wrist cath site: Stable no ecchymosis good distal pulse Neurologic: Grossly normal  Lab Results:   Recent Labs  10/04/14 1316 10/04/14 1323 10/05/14 0050  WBC 5.8  --  7.7  HGB 14.6 14.6 14.3  HCT 42.4 43.0 42.8  PLT 174  --  197  195   BMET  Recent Labs  10/04/14 1316 10/04/14 1323 10/05/14 0050  NA 138 142 137  K 3.3* 3.4* 3.4*  CL 107 105 102  CO2 25  --  26  GLUCOSE 117* 117* 135*  BUN CREATININE 0.85 0.80 0.81  CALCIUM 9.0  --  9.1   PT/INR  Recent Labs  10/04/14 1447  LABPROT 13.2  INR 0.98     Assessment/Plan  Active Problems:   Chest pain   Unstable angina   Hypokalemia  Status post left heart catheterization revealing severe single vessel CAD involving the RCA which was  treated successfully with PTCA and stenting (DES).  Mild nonobstructive disease of the LAD (40%). Widely patent LCx. Preserved overall LV function.  Patient is on aspirin and Effient. Also on Lipitor 80 mg and metoprolol 25 twice a day Blood pressure and heart rate controlled.  Replace K.  Ambulate with cardiac rehabilitation. If does well, discharged home.   LOS: 1 day    HAGER, BRYAN PA-C 10/05/2014 7:30 AM  I have seen, examined the patient, and reviewed the above assessment and plan.  On exam, comfortable appearing. Changes to above are made where necessary.    Co Sign: Hillis Range, MD 10/05/2014 8:42 AM

## 2014-10-05 NOTE — Progress Notes (Signed)
CARDIAC REHAB PHASE I   PRE:  Rate/Rhythm: 73 NSR  BP:   Sitting: 162/86 automatic      144/80 manual     MODE:  Ambulation: 950 ft   POST:  Rate/Rhythm: 86  BP:   Sitting: 156/82 manual   830-925 Pt ambulated 958ft, independently and without CP, SOB or diaphoresis. Pt mentioned that it felt great to be moving. Pt walked a moderate pace. Returned pt to recliner with LE elevated and VSS. Education was completed with pt in which the following was discussed: HH diet, exercise guidelines, importance of antiplatelets (effient and ASA), stent card, when to use NTG and when to call 911. Pt info will be sent to [facility] and [facility] for cardiac rehab phase II.     Gayle Collard D Wagner Tanzi,MS,ACSM-RCEP 10/05/2014 10:16 AM

## 2014-10-06 ENCOUNTER — Other Ambulatory Visit: Payer: Self-pay | Admitting: Cardiology

## 2014-10-06 DIAGNOSIS — I2510 Atherosclerotic heart disease of native coronary artery without angina pectoris: Secondary | ICD-10-CM

## 2014-10-06 MED ORDER — NITROSTAT 0.4 MG SL SUBL: 0.4000 mg | SUBLINGUAL_TABLET | SUBLINGUAL | Status: DC | PRN

## 2014-10-06 NOTE — Progress Notes (Signed)
CM met with pt and gave free 30 day trial card for Effient.  Pt verbalized unerstanding today's prescription will be covered with this card and give insurance enough time to authorize medication for refills. No other CM needs were communicated. 

## 2014-10-08 ENCOUNTER — Encounter: Payer: Self-pay | Admitting: Cardiology

## 2014-10-28 ENCOUNTER — Encounter: Payer: Self-pay | Admitting: Cardiology

## 2014-10-28 ENCOUNTER — Ambulatory Visit (INDEPENDENT_AMBULATORY_CARE_PROVIDER_SITE_OTHER): Payer: BLUE CROSS/BLUE SHIELD | Admitting: Cardiology

## 2014-10-28 VITALS — BP 104/66 | HR 56 | Ht 69.0 in | Wt 219.0 lb

## 2014-10-28 DIAGNOSIS — I2510 Atherosclerotic heart disease of native coronary artery without angina pectoris: Secondary | ICD-10-CM

## 2014-10-28 DIAGNOSIS — E785 Hyperlipidemia, unspecified: Secondary | ICD-10-CM

## 2014-10-28 MED ORDER — PRASUGREL HCL 10 MG PO TABS: 10.0000 mg | ORAL_TABLET | Freq: Every day | ORAL | Status: DC

## 2014-10-28 MED ORDER — SIMVASTATIN 20 MG PO TABS: 20.0000 mg | ORAL_TABLET | Freq: Every day | ORAL | Status: DC

## 2014-10-28 MED ORDER — METOPROLOL TARTRATE 25 MG PO TABS: 25.0000 mg | ORAL_TABLET | Freq: Two times a day (BID) | ORAL | Status: DC

## 2014-10-28 NOTE — Progress Notes (Signed)
Patient ID: Dennis Terry, male   DOB: 09/13/1965, 49 y.o.   MRN: [id number]     Clinical Summary Mr. Fawver is a 49 y.o.male seen today for hospital follow up, this is our first visit together.   1. CAD - previous NSTEMI 12/2008 where he received a DES to RCA - admit 10/2014 with chest pain, trop up to 0.08. LHC showed RCA disease again that received a DES. Normal LVEF by LVgram during that admit. 2010 echo LVEF 55% with moderate to severe inferior hypokinesis.   - denies any chest pain. Denies any SOB or DOE. Walks 2 miles daily without troubles.  - compliant with meds. Medical therapy limited by soft bp's and low heart rates  2. Hyperlipidemia - muscle aches on lipitor and crestor. Notes mild aches on low dose simva but tolerable. Atypical for statin induced myalgias as mainly involve his feet after standing or working long periods of time.    Past Medical History  Diagnosis Date  . CAD (coronary artery disease), native coronary artery   . Hyperlipidemia   . Myocardial infarction (HCC) 12/29/2008  . Pneumonia 02/2014    "not sure if it was pneumonia or flu"     Allergies  Allergen Reactions  . Oxycodone-Acetaminophen Shortness Of Breath and Other (See Comments)    Chest tightness and numbness      Current Outpatient Prescriptions  Medication Sig Dispense Refill  . aspirin 81 MG chewable tablet Chew 1 tablet (81 mg total) by mouth daily.    . metoprolol tartrate (LOPRESSOR) 25 MG tablet Take 1 tablet (25 mg total) by mouth 2 (two) times daily. 60 tablet 11  . NITROSTAT 0.4 MG SL tablet Place 1 tablet (0.4 mg total) under the tongue every 5 (five) minutes as needed for chest pain. 25 tablet 4  . prasugrel (EFFIENT) 10 MG TABS tablet Take 1 tablet (10 mg total) by mouth daily. 30 tablet 11  . simvastatin (ZOCOR) 20 MG tablet Take 1 tablet (20 mg total) by mouth daily. 30 tablet 11   No current facility-administered medications for this visit.     Past Surgical History    Procedure Laterality Date  . Coronary angioplasty with stent placement  2010; 10/04/2014  . Cardiac catheterization  2011  . Appendectomy  ~ 2008  . Fracture surgery    . Forearm fracture surgery Left 1997  . Wrist fracture surgery Left 1997    w/thumb fracture  . Finger fracture surgery Left ~ 2010    "middle finger"  . Cardiac catheterization N/A 10/04/2014    Procedure: Left Heart Cath and Coronary Angiography;  Surgeon: Tonny Bollman, MD;  Location: [facility] INVASIVE CV LAB;  Service: Cardiovascular;  Laterality: N/A;  . Cardiac catheterization N/A 10/04/2014    Procedure: Coronary Stent Intervention;  Surgeon: Tonny Bollman, MD;  Location: [facility] INVASIVE CV LAB;  Service: Cardiovascular;  Laterality: N/A;     Allergies  Allergen Reactions  . Oxycodone-Acetaminophen Shortness Of Breath and Other (See Comments)    Chest tightness and numbness       Family History  Problem Relation Age of Onset  . Heart failure Father   . Cancer Other      Social History Mr. Whistler reports that he quit smoking about 5 years ago. His smoking use included Cigarettes. He has a 45 pack-year smoking history. He has never used smokeless tobacco. Mr. Conry reports that he drinks alcohol.   Review of Systems CONSTITUTIONAL: No weight loss, fever, chills, weakness or  fatigue.  HEENT: Eyes: No visual loss, blurred vision, double vision or yellow sclerae.No hearing loss, sneezing, congestion, runny nose or sore throat.  SKIN: No rash or itching.  CARDIOVASCULAR: per HPI RESPIRATORY: No shortness of breath, cough or sputum.  GASTROINTESTINAL: No anorexia, nausea, vomiting or diarrhea. No abdominal pain or blood.  GENITOURINARY: No burning on urination, no polyuria NEUROLOGICAL: No headache, dizziness, syncope, paralysis, ataxia, numbness or tingling in the extremities. No change in bowel or bladder control.  MUSCULOSKELETAL: foot pain LYMPHATICS: No enlarged nodes. No history of splenectomy.   PSYCHIATRIC: No history of depression or anxiety.  ENDOCRINOLOGIC: No reports of sweating, cold or heat intolerance. No polyuria or polydipsia.  Marland Kitchen.   Physical Examination Filed Vitals:   10/28/14 1006  BP: 104/66  Pulse: 56   Filed Vitals:   10/28/14 1006  Height: 5\' 9"  (1.753 m)  Weight: 219 lb (99.338 kg)    Gen: resting comfortably, no acute distress HEENT: no scleral icterus, pupils equal round and reactive, no palptable cervical adenopathy,  CV: RRR, no m/r/g, no jvd Resp: Clear to auscultation bilaterally GI: abdomen is soft, non-tender, non-distended, normal bowel sounds, no hepatosplenomegaly MSK: extremities are warm, no edema.  Skin: warm, no rash Neuro:  no focal deficits Psych: appropriate affect   Diagnostic Studies 09/2014 Cath  Mid RCA lesion, 90% stenosed.  Mid LAD lesion, 40% stenosed.  The left ventricular systolic function is normal.  Prox RCA lesion, 60% stenosed. There is a 0% residual stenosis post intervention. The lesion was previously treated with a stent (unknown type) .  A drug-eluting stent was placed.  1. Severe single vessel CAD involving the RCA treated successfully with PTCA and stenting (DES) 2. Mild nonobstructive disease of the LAD 3. Widely patent LCx 4. Preserved overall LV function  Continue DAPT with ASA and Effient x 12 months as tolerated     Assessment and Plan  1. CAD - s/p NSTEMI 10/2014 with DES to RCA placed - no current symptoms, will continue current meds. No ACE-I due to soft bp's  2. Hyperlipidemia - side effects to atorva and crestor previously. Tolerating low dose simva though does not some fairly atypical foot pains at times. Will continue to follow, if needed can consider low dose pravastatin    F/u 1 year per patient request. Asked to please contact us if any issues before that time.     Antoine PocheJonathan F. Melanie Openshaw, M.D.

## 2014-10-28 NOTE — Patient Instructions (Signed)
Your physician wants you to follow-up in: 1 YEAR WITH DR. BRANCH You will receive a reminder letter in the mail two months in advance. If you don't receive a letter, please call our office to schedule the follow-up appointment.  Your physician recommends that you continue on your current medications as directed. Please refer to the Current Medication list given to you today.  WE HAVE REFILLED YOUR CARDIAC MEDICATIONS.  Thank you for choosing [facility]!!

## 2014-10-30 ENCOUNTER — Encounter: Payer: BLUE CROSS/BLUE SHIELD | Admitting: Cardiology

## 2015-03-03 ENCOUNTER — Telehealth: Payer: Self-pay | Admitting: Cardiology

## 2015-03-04 NOTE — Telephone Encounter (Signed)
Patient with recent stent in 10/2014. He cannot stop his aspirin or plavix at this time. Cleaning should not be an issue but any tooth extraction would cause significant bleeding and would look to avoid. He does not need antibiotics before. After 10/2015 of this year he will be able to stop aspirin and plavix if needed for extraction.   Dominga Ferry MD

## 2015-03-04 NOTE — Telephone Encounter (Signed)
Spoke with Dennis Terry, will fax this note as requested.

## 2015-07-16 ENCOUNTER — Telehealth: Payer: Self-pay | Admitting: Cardiology

## 2015-07-16 NOTE — Telephone Encounter (Signed)
[facility]

## 2015-07-17 NOTE — Telephone Encounter (Signed)
Reviewed chart  - do not see where we contacted this pt

## 2015-08-03 ENCOUNTER — Emergency Department (HOSPITAL_COMMUNITY)
Admission: EM | Admit: 2015-08-03 | Discharge: 2015-08-03 | Disposition: A | Discharge status: Home / Self Care | Payer: BLUE CROSS/BLUE SHIELD | Attending: Emergency Medicine | Admitting: Emergency Medicine

## 2015-08-03 ENCOUNTER — Encounter (HOSPITAL_COMMUNITY): Payer: Self-pay

## 2015-08-03 DIAGNOSIS — I252 Old myocardial infarction: Secondary | ICD-10-CM | POA: Diagnosis not present

## 2015-08-03 DIAGNOSIS — Z79899 Other long term (current) drug therapy: Secondary | ICD-10-CM | POA: Diagnosis not present

## 2015-08-03 DIAGNOSIS — I2510 Atherosclerotic heart disease of native coronary artery without angina pectoris: Secondary | ICD-10-CM | POA: Insufficient documentation

## 2015-08-03 DIAGNOSIS — N419 Inflammatory disease of prostate, unspecified: Secondary | ICD-10-CM | POA: Insufficient documentation

## 2015-08-03 DIAGNOSIS — Z7982 Long term (current) use of aspirin: Secondary | ICD-10-CM | POA: Diagnosis not present

## 2015-08-03 DIAGNOSIS — Z955 Presence of coronary angioplasty implant and graft: Secondary | ICD-10-CM | POA: Diagnosis not present

## 2015-08-03 DIAGNOSIS — R509 Fever, unspecified: Secondary | ICD-10-CM | POA: Diagnosis not present

## 2015-08-03 DIAGNOSIS — Z87891 Personal history of nicotine dependence: Secondary | ICD-10-CM | POA: Insufficient documentation

## 2015-08-03 DIAGNOSIS — R300 Dysuria: Secondary | ICD-10-CM | POA: Diagnosis present

## 2015-08-03 DIAGNOSIS — R52 Pain, unspecified: Secondary | ICD-10-CM | POA: Diagnosis not present

## 2015-08-03 LAB — CBC
HCT: 43.6 % (ref 39.0–52.0)
Hemoglobin: 14.5 g/dL (ref 13.0–17.0)
MCH: 30.4 pg (ref 26.0–34.0)
MCHC: 33.3 g/dL (ref 30.0–36.0)
MCV: 91.4 fL (ref 78.0–100.0)
Platelets: 136 10*3/uL — ABNORMAL LOW (ref 150–400)
RBC: 4.77 MIL/uL (ref 4.22–5.81)
RDW: 14.2 % (ref 11.5–15.5)
WBC: 3.4 10*3/uL — ABNORMAL LOW (ref 4.0–10.5)

## 2015-08-03 LAB — URINALYSIS, ROUTINE W REFLEX MICROSCOPIC
Glucose, UA: 100 mg/dL — AB
Hgb urine dipstick: NEGATIVE
Ketones, ur: 15 mg/dL — AB
Leukocytes, UA: NEGATIVE
Nitrite: NEGATIVE
Protein, ur: 100 mg/dL — AB
Specific Gravity, Urine: 1.034 — ABNORMAL HIGH (ref 1.005–1.030)
pH: 5.5 (ref 5.0–8.0)

## 2015-08-03 LAB — BASIC METABOLIC PANEL
Anion gap: 7 (ref 5–15)
BUN: 11 mg/dL (ref 6–20)
CO2: 25 mmol/L (ref 22–32)
Calcium: 8.7 mg/dL — ABNORMAL LOW (ref 8.9–10.3)
Chloride: 102 mmol/L (ref 101–111)
Creatinine, Ser: 0.99 mg/dL (ref 0.61–1.24)
GFR calc Af Amer: >60 mL/min (ref >60)
GFR calc non Af Amer: >60 mL/min (ref >60)
Glucose, Bld: 174 mg/dL — ABNORMAL HIGH (ref 65–99)
Potassium: 3.8 mmol/L (ref 3.5–5.1)
Sodium: 134 mmol/L — ABNORMAL LOW (ref 135–145)

## 2015-08-03 LAB — URINE MICROSCOPIC-ADD ON
RBC / HPF: NONE SEEN RBC/hpf (ref 0–5)
WBC, UA: NONE SEEN WBC/hpf (ref 0–5)

## 2015-08-03 LAB — TROPONIN I: Troponin I: <0.03 ng/mL (ref <0.03)

## 2015-08-03 MED ORDER — SODIUM CHLORIDE 0.9 % IV BOLUS (SEPSIS)
1000.0000 mL | Freq: Once | INTRAVENOUS | Status: AC
  Administered 2015-08-03: 1000 mL via INTRAVENOUS

## 2015-08-03 MED ORDER — KETOROLAC TROMETHAMINE 30 MG/ML IJ SOLN
30.0000 mg | Freq: Once | INTRAMUSCULAR | Status: AC
  Administered 2015-08-03: 30 mg via INTRAVENOUS
  Filled 2015-08-03: qty 1

## 2015-08-03 MED ORDER — DEXTROSE 5 % IV SOLN
1.0000 g | Freq: Once | INTRAVENOUS | Status: AC
  Administered 2015-08-03: 1 g via INTRAVENOUS
  Filled 2015-08-03: qty 10

## 2015-08-03 MED ORDER — ACETAMINOPHEN 500 MG PO TABS
1000.0000 mg | ORAL_TABLET | Freq: Once | ORAL | Status: AC
  Administered 2015-08-03: 1000 mg via ORAL
  Filled 2015-08-03: qty 2

## 2015-08-03 MED ORDER — PHENAZOPYRIDINE HCL 200 MG PO TABS: 200.0000 mg | ORAL_TABLET | Freq: Three times a day (TID) | ORAL | 0 refills | Status: DC

## 2015-08-03 MED ORDER — PHENAZOPYRIDINE HCL 200 MG PO TABS
200.0000 mg | ORAL_TABLET | Freq: Three times a day (TID) | ORAL | 0 refills | Status: DC
Start: 2015-08-03 — End: 2015-09-11

## 2015-08-03 MED ORDER — PHENAZOPYRIDINE HCL 100 MG PO TABS
100.0000 mg | ORAL_TABLET | Freq: Once | ORAL | Status: AC
  Administered 2015-08-03: 100 mg via ORAL
  Filled 2015-08-03: qty 1

## 2015-08-03 MED ORDER — CIPROFLOXACIN HCL 500 MG PO TABS
500.0000 mg | ORAL_TABLET | Freq: Two times a day (BID) | ORAL | 0 refills | Status: DC
Start: 2015-08-03 — End: 2015-09-11

## 2015-08-03 MED ORDER — CIPROFLOXACIN HCL 500 MG PO TABS: 500.0000 mg | ORAL_TABLET | Freq: Two times a day (BID) | ORAL | 0 refills | Status: DC

## 2015-08-03 NOTE — ED Notes (Signed)
Pt states given IM Toradol at UC.

## 2015-08-03 NOTE — ED Triage Notes (Signed)
Pt seen by Urgent care. Sent here for follow-up. States sweats and chills and flu-like symptoms, painful urination. States recent removal of tick. Pt complaining of burning with urination and low back pain. States generalized malaise.

## 2015-08-03 NOTE — Discharge Instructions (Signed)
Push fluids.  Return to ER with lightheadedness dizziness nausea vomiting or other symptoms.  Take a probiotic twice per day at a different time than your Cipro.

## 2015-08-04 LAB — URINE CULTURE: Culture: NO GROWTH

## 2015-08-04 NOTE — ED Provider Notes (Addendum)
[facility]-EMERGENCY DEPT Provider Note   CSN: [id number] Arrival date & time: 08/03/15  1513  First Provider Contact:  16:00pm      History   Chief Complaint Chief Complaint  Patient presents with  . Flank Pain  . Dysuria    HPI Dennis Terry is a 50 y.o. male.  Seen by urgent care. He describes a three-day illness. Fever sets chills. Pain with urination and urinary frequency last 2-3 days. Intermittent shakes and chills at work is been able to continue work until yesterday.  Short of breath. For appetite but no nausea vomiting or diarrhea. Previous tick bite but no rash noted joint pain neck no headache. Has never had history of UTI or prostatitis in the past.  HPI  Past Medical History:  Diagnosis Date  . CAD (coronary artery disease), native coronary artery   . Hyperlipidemia   . Myocardial infarction (HCC) 12/29/2008  . Pneumonia 02/2014   "not sure if it was pneumonia or flu"    Patient Active Problem List   Diagnosis Date Noted  . Chest pain 10/04/2014  . Unstable angina (HCC) 10/04/2014  . TOBACCO ABUSE 01/23/2009  . Hyperlipidemia 01/22/2009  . CAD, NATIVE VESSEL 01/22/2009  . BRADYCARDIA 01/22/2009  . HYPOTENSION, UNSPECIFIED 01/22/2009    Past Surgical History:  Procedure Laterality Date  . APPENDECTOMY  ~ 2008  . CARDIAC CATHETERIZATION  2011  . CARDIAC CATHETERIZATION N/A 10/04/2014   Procedure: Left Heart Cath and Coronary Angiography;  Surgeon: Tonny Bollman, MD;  Location: [facility] INVASIVE CV LAB;  Service: Cardiovascular;  Laterality: N/A;  . CARDIAC CATHETERIZATION N/A 10/04/2014   Procedure: Coronary Stent Intervention;  Surgeon: Tonny Bollman, MD;  Location: [facility] INVASIVE CV LAB;  Service: Cardiovascular;  Laterality: N/A;  . CORONARY ANGIOPLASTY WITH STENT PLACEMENT  2010; 10/04/2014  . FINGER FRACTURE SURGERY Left ~ 2010   "middle finger"  . FOREARM FRACTURE SURGERY Left 1997  . FRACTURE SURGERY    . WRIST FRACTURE SURGERY Left 1997   w/thumb fracture         Home Medications    Prior to Admission medications   Medication Sig Start Date End Date Taking? Authorizing Provider  aspirin 81 MG chewable tablet Chew 1 tablet (81 mg total) by mouth daily. 10/05/14  Yes Dwana Melena, PA-C  atorvastatin (LIPITOR) 80 MG tablet Take 80 mg by mouth daily.   Yes Historical Provider, MD  metoprolol tartrate (LOPRESSOR) 25 MG tablet Take 1 tablet (25 mg total) by mouth 2 (two) times daily. 10/28/14  Yes Antoine Poche, MD  NITROSTAT 0.4 MG SL tablet Place 1 tablet (0.4 mg total) under the tongue every 5 (five) minutes as needed for chest pain. 10/06/14  Yes Leone Brand, NP  prasugrel (EFFIENT) 10 MG TABS tablet Take 1 tablet (10 mg total) by mouth daily. 10/28/14  Yes Antoine Poche, MD  simvastatin (ZOCOR) 20 MG tablet Take 1 tablet (20 mg total) by mouth daily. 10/28/14  Yes Antoine Poche, MD  ciprofloxacin (CIPRO) 500 MG tablet Take 1 tablet (500 mg total) by mouth 2 (two) times daily. 08/03/15   Rolland Porter, MD  ciprofloxacin (CIPRO) 500 MG tablet Take 1 tablet (500 mg total) by mouth every 12 (twelve) hours. 08/03/15   Rolland Porter, MD  phenazopyridine (PYRIDIUM) 200 MG tablet Take 1 tablet (200 mg total) by mouth 3 (three) times daily. 08/03/15   Rolland Porter, MD  phenazopyridine (PYRIDIUM) 200 MG tablet Take 1 tablet (200 mg total) by  mouth 3 (three) times daily. 08/03/15   Rolland Porter, MD    Family History Family History  Problem Relation Age of Onset  . Heart failure Father   . Cancer Other     Social History Social History  Substance Use Topics  . Smoking status: Former Smoker    Packs/day: 1.50    Years: 30.00    Types: Cigarettes    Quit date: 12/29/2008  . Smokeless tobacco: Never Used  . Alcohol use 0.0 oz/week     Comment: 10/04/2014 "none in the 2000's"     Allergies   Oxycodone-acetaminophen and Lipitor [atorvastatin]   Review of Systems Review of Systems  Constitutional: Positive for chills, diaphoresis, fatigue  and fever. Negative for appetite change.  HENT: Negative for mouth sores, sore throat and trouble swallowing.   Eyes: Negative for visual disturbance.  Respiratory: Negative for cough, chest tightness, shortness of breath and wheezing.   Cardiovascular: Negative for chest pain.  Gastrointestinal: Negative for abdominal distention, abdominal pain, diarrhea, nausea and vomiting.  Endocrine: Negative for polydipsia, polyphagia and polyuria.  Genitourinary: Positive for dysuria, frequency and urgency. Negative for hematuria.  Musculoskeletal: Positive for myalgias. Negative for gait problem.  Skin: Negative for color change, pallor and rash.  Neurological: Negative for dizziness, syncope, light-headedness and headaches.  Hematological: Does not bruise/bleed easily.  Psychiatric/Behavioral: Negative for behavioral problems and confusion.     Physical Exam Updated Vital Signs BP 132/76   Pulse 87   Temp 98.7 F (37.1 C)   Resp 18   Ht 5\' 9"  (1.753 m)   Wt 225 lb (102.1 kg)   SpO2 98%   BMI 33.23 kg/m   Physical Exam  Constitutional: He is oriented to person, place, and time. He appears well-developed and well-nourished. No distress.  HENT:  Head: Normocephalic.  Eyes: Conjunctivae are normal. Pupils are equal, round, and reactive to light. No scleral icterus.  Neck: Normal range of motion. Neck supple. No thyromegaly present.  Cardiovascular: Normal rate and regular rhythm.  Exam reveals no gallop and no friction rub.   No murmur heard. Pulmonary/Chest: Effort normal and breath sounds normal. No respiratory distress. He has no wheezes. He has no rales.  Abdominal: Soft. Bowel sounds are normal. He exhibits no distension. There is no tenderness. There is no rebound.  Musculoskeletal: Normal range of motion.  Neurological: He is alert and oriented to person, place, and time.  Skin: Skin is warm and dry. No rash noted.  Psychiatric: He has a normal mood and affect. His behavior is  normal.     ED Treatments / Results  Labs (all labs ordered are listed, but only abnormal results are displayed) Labs Reviewed  URINALYSIS, ROUTINE W REFLEX MICROSCOPIC (NOT AT [facility]) - Abnormal; Notable for the following:       Result Value   APPearance CLOUDY (*)    Specific Gravity, Urine 1.034 (*)    Glucose, UA 100 (*)    Bilirubin Urine SMALL (*)    Ketones, ur 15 (*)    Protein, ur 100 (*)    All other components within normal limits  BASIC METABOLIC PANEL - Abnormal; Notable for the following:    Sodium 134 (*)    Glucose, Bld 174 (*)    Calcium 8.7 (*)    All other components within normal limits  CBC - Abnormal; Notable for the following:    WBC 3.4 (*)    Platelets 136 (*)    All other components within  normal limits  URINE MICROSCOPIC-ADD ON - Abnormal; Notable for the following:    Squamous Epithelial / LPF 0-5 (*)    Bacteria, UA FEW (*)    All other components within normal limits  URINE CULTURE  TROPONIN I    EKG  EKG Interpretation None       Radiology No results found.  Procedures Procedures (including critical care time)  Medications Ordered in ED Medications  cefTRIAXone (ROCEPHIN) 1 g in dextrose 5 % 50 mL IVPB (0 g Intravenous Stopped 08/03/15 1805)  ketorolac (TORADOL) 30 MG/ML injection 30 mg (30 mg Intravenous Given 08/03/15 1735)  phenazopyridine (PYRIDIUM) tablet 100 mg (100 mg Oral Given 08/03/15 1735)  sodium chloride 0.9 % bolus 1,000 mL (0 mLs Intravenous Stopped 08/03/15 1914)  acetaminophen (TYLENOL) tablet 1,000 mg (1,000 mg Oral Given 08/03/15 1735)     Initial Impression / Assessment and Plan / ED Course  I have reviewed the triage vital signs and the nursing notes.  Pertinent labs & imaging results that were available during my care of the patient were reviewed by me and considered in my medical decision making (see chart for details).  Clinical Course   Pt stable and feeling well after IVF and meds.  Appropriate for DC,  return precautions discussed.   Final Clinical Impressions(s) / ED Diagnoses   Final diagnoses:  Prostatitis, unspecified prostatitis type    New Prescriptions Discharge Medication List as of 08/03/2015  7:02 PM    START taking these medications   Details  !! ciprofloxacin (CIPRO) 500 MG tablet Take 1 tablet (500 mg total) by mouth 2 (two) times daily., Starting Sun 08/03/2015, Print    !! ciprofloxacin (CIPRO) 500 MG tablet Take 1 tablet (500 mg total) by mouth every 12 (twelve) hours., Starting Sun 08/03/2015, Print    !! phenazopyridine (PYRIDIUM) 200 MG tablet Take 1 tablet (200 mg total) by mouth 3 (three) times daily., Starting Sun 08/03/2015, Print    !! phenazopyridine (PYRIDIUM) 200 MG tablet Take 1 tablet (200 mg total) by mouth 3 (three) times daily., Starting Sun 08/03/2015, Print     !! - Potential duplicate medications found. Please discuss with provider.       Rolland Porter, MD 08/11/15 5883    Rolland Porter, MD 12/20/15 [phone]

## 2015-08-07 ENCOUNTER — Telehealth: Payer: Self-pay | Admitting: *Deleted

## 2015-08-07 NOTE — Telephone Encounter (Signed)
Pt called to schedule 1 yr f/u but requested sooner appt than October. Pt says he has been taking atorvastatin and simvastatin (we did not have atorvastatin on med list nor did we prescribe it) pt says PA send rx for atorvastatin in. Pt has been taking both. Pt advised to only take simvastatin as per LOV. Pt voiced understanding and scheduled appt for 9/7

## 2015-09-11 ENCOUNTER — Encounter: Payer: Self-pay | Admitting: Cardiology

## 2015-09-11 ENCOUNTER — Ambulatory Visit (INDEPENDENT_AMBULATORY_CARE_PROVIDER_SITE_OTHER): Payer: BLUE CROSS/BLUE SHIELD | Admitting: Cardiology

## 2015-09-11 VITALS — BP 134/88 | HR 75 | Ht 69.0 in | Wt 224.6 lb

## 2015-09-11 DIAGNOSIS — E785 Hyperlipidemia, unspecified: Secondary | ICD-10-CM

## 2015-09-11 DIAGNOSIS — I2510 Atherosclerotic heart disease of native coronary artery without angina pectoris: Secondary | ICD-10-CM

## 2015-09-11 MED ORDER — LISINOPRIL 2.5 MG PO TABS: 2.5000 mg | ORAL_TABLET | Freq: Every day | ORAL | 3 refills | Status: DC

## 2015-09-11 MED ORDER — ATORVASTATIN CALCIUM 80 MG PO TABS: 80.0000 mg | ORAL_TABLET | Freq: Every day | ORAL | 3 refills | Status: DC

## 2015-09-11 NOTE — Progress Notes (Signed)
Clinical Summary Mr. Messler is a 50 y.o.male seen today for follow up of the following medical problems.   1. CAD - previous NSTEMI 12/2008 where he received a DES to RCA - admit 10/2014 with chest pain, trop up to 0.08. LHC showed RCA disease again that received a DES. Normal LVEF by LVgram during that admit. 2010 echo LVEF 55% with moderate to severe inferior hypokinesis.   - reports episode of chest pain. Occurred after walking 1/4 way of trail about 4 months ago. Tightness in midchest, 2/10. Had some SOB at that time. Slowed pace and symptoms resolved. Was able to finish walk after. Continues to walk regularly up to 2-5 miles  without symptoms at fast pace.  - compliant with meds   2. Hyperlipidemia - muscle aches on lipitor and crestor. Notes mild aches on low dose simva but tolerable. Atypical for statin induced myalgias as mainly involve his feet after standing or working long periods of time.   - was taking atorva and simva at home by mistake.    SH: works in Designer, fashion/clothing, Special educational needs teacher.  Past Medical History:  Diagnosis Date  . CAD (coronary artery disease), native coronary artery   . Hyperlipidemia   . Myocardial infarction (HCC) 12/29/2008  . Pneumonia 02/2014   "not sure if it was pneumonia or flu"     Allergies  Allergen Reactions  . Oxycodone-Acetaminophen Shortness Of Breath and Other (See Comments)    Chest tightness and numbness   . Lipitor [Atorvastatin] Other (See Comments)    Muscle aches      Current Outpatient Prescriptions  Medication Sig Dispense Refill  . aspirin 81 MG chewable tablet Chew 1 tablet (81 mg total) by mouth daily.    Marland Kitchen atorvastatin (LIPITOR) 80 MG tablet Take 80 mg by mouth daily.    . ciprofloxacin (CIPRO) 500 MG tablet Take 1 tablet (500 mg total) by mouth 2 (two) times daily. 20 tablet 0  . ciprofloxacin (CIPRO) 500 MG tablet Take 1 tablet (500 mg total) by mouth every 12 (twelve) hours. 28 tablet 0  . metoprolol tartrate  (LOPRESSOR) 25 MG tablet Take 1 tablet (25 mg total) by mouth 2 (two) times daily. 60 tablet 11  . NITROSTAT 0.4 MG SL tablet Place 1 tablet (0.4 mg total) under the tongue every 5 (five) minutes as needed for chest pain. 25 tablet 4  . phenazopyridine (PYRIDIUM) 200 MG tablet Take 1 tablet (200 mg total) by mouth 3 (three) times daily. 6 tablet 0  . phenazopyridine (PYRIDIUM) 200 MG tablet Take 1 tablet (200 mg total) by mouth 3 (three) times daily. 6 tablet 0  . prasugrel (EFFIENT) 10 MG TABS tablet Take 1 tablet (10 mg total) by mouth daily. 30 tablet 11  . simvastatin (ZOCOR) 20 MG tablet Take 1 tablet (20 mg total) by mouth daily. 30 tablet 11   No current facility-administered medications for this visit.      Past Surgical History:  Procedure Laterality Date  . APPENDECTOMY  ~ 2008  . CARDIAC CATHETERIZATION  2011  . CARDIAC CATHETERIZATION N/A 10/04/2014   Procedure: Left Heart Cath and Coronary Angiography;  Surgeon: Tonny Bollman, MD;  Location: [facility] INVASIVE CV LAB;  Service: Cardiovascular;  Laterality: N/A;  . CARDIAC CATHETERIZATION N/A 10/04/2014   Procedure: Coronary Stent Intervention;  Surgeon: Tonny Bollman, MD;  Location: [facility] INVASIVE CV LAB;  Service: Cardiovascular;  Laterality: N/A;  . CORONARY ANGIOPLASTY WITH STENT PLACEMENT  2010; 10/04/2014  . FINGER  FRACTURE SURGERY Left ~ 2010   "middle finger"  . FOREARM FRACTURE SURGERY Left 1997  . FRACTURE SURGERY    . WRIST FRACTURE SURGERY Left 1997   w/thumb fracture     Allergies  Allergen Reactions  . Oxycodone-Acetaminophen Shortness Of Breath and Other (See Comments)    Chest tightness and numbness   . Lipitor [Atorvastatin] Other (See Comments)    Muscle aches       Family History  Problem Relation Age of Onset  . Heart failure Father   . Cancer Other      Social History Mr. Katrinka BlazingSmith reports that he quit smoking about 6 years ago. His smoking use included Cigarettes. He has a 45.00 pack-year smoking  history. He has never used smokeless tobacco. Mr. Katrinka BlazingSmith reports that he drinks alcohol.   Review of Systems CONSTITUTIONAL: No weight loss, fever, chills, weakness or fatigue.  HEENT: Eyes: No visual loss, blurred vision, double vision or yellow sclerae.No hearing loss, sneezing, congestion, runny nose or sore throat.  SKIN: No rash or itching.  CARDIOVASCULAR: per HPI RESPIRATORY: No shortness of breath, cough or sputum.  GASTROINTESTINAL: No anorexia, nausea, vomiting or diarrhea. No abdominal pain or blood.  GENITOURINARY: No burning on urination, no polyuria NEUROLOGICAL: No headache, dizziness, syncope, paralysis, ataxia, numbness or tingling in the extremities. No change in bowel or bladder control.  MUSCULOSKELETAL: No muscle, back pain, joint pain or stiffness.  LYMPHATICS: No enlarged nodes. No history of splenectomy.  PSYCHIATRIC: No history of depression or anxiety.  ENDOCRINOLOGIC: No reports of sweating, cold or heat intolerance. No polyuria or polydipsia.  Marland Kitchen.   Physical Examination Vitals:   09/11/15 1425  BP: 134/88  Pulse: 75   Vitals:   09/11/15 1425  Weight: 224 lb 9.6 oz (101.9 kg)  Height: 5\' 9"  (1.753 m)    Gen: resting comfortably, no acute distress HEENT: no scleral icterus, pupils equal round and reactive, no palptable cervical adenopathy,  CV: RRR, no m/r/g, no jvd Resp: Clear to auscultation bilaterally GI: abdomen is soft, non-tender, non-distended, normal bowel sounds, no hepatosplenomegaly MSK: extremities are warm, no edema.  Skin: warm, no rash Neuro:  no focal deficits Psych: appropriate affect   Diagnostic Studies  09/2014 Cath  Mid RCA lesion, 90% stenosed.  Mid LAD lesion, 40% stenosed.  The left ventricular systolic function is normal.  Prox RCA lesion, 60% stenosed. There is a 0% residual stenosis post intervention. The lesion was previously treated with a stent (unknown type) .  A drug-eluting stent was placed.  1. Severe  single vessel CAD involving the RCA treated successfully with PTCA and stenting (DES) 2. Mild nonobstructive disease of the LAD 3. Widely patent LCx 4. Preserved overall LV function  Continue DAPT with ASA and Effient x 12 months as tolerated   Assessment and Plan   1. CAD - s/p NSTEMI 10/2014 with DES to RCA placed - no current symptoms. Stop effient 10/04/15 Start low dose lisinopril 2.5mg  daily in setting of CAD. Check BMET in 2 weeks.   2. Hyperlipidemia - will try atorva 80mg  daily to see if tolerated.     F/u 6 months      Antoine PocheJonathan F. Anzleigh Slaven, M.D.

## 2015-09-11 NOTE — Patient Instructions (Signed)
Your physician wants you to follow-up in: 6 MONTHS WITH DR. BRANCH You will receive a reminder letter in the mail two months in advance. If you don't receive a letter, please call our office to schedule the follow-up appointment.  Your physician has recommended you make the following change in your medication:   STOP EFFIENT 10/04/15  HOLD SIMVASTATIN   START ATORVASTATIN 80 MG DAILY  START LISINOPRIL 2.5 MG DAILY  Your physician recommends that you return for lab work in: 2 WEEKS BMP  Thank you for choosing [facility]!!

## 2015-10-22 ENCOUNTER — Encounter: Payer: Self-pay | Admitting: *Deleted

## 2015-11-10 ENCOUNTER — Other Ambulatory Visit: Payer: Self-pay | Admitting: Cardiology

## 2015-11-25 ENCOUNTER — Telehealth: Payer: Self-pay | Admitting: Cardiology

## 2015-11-25 ENCOUNTER — Encounter: Payer: Self-pay | Admitting: *Deleted

## 2015-11-25 NOTE — Telephone Encounter (Signed)
Question about blood work

## 2015-11-25 NOTE — Telephone Encounter (Signed)
No VM no answer X2 - will send mychart message

## 2015-12-12 ENCOUNTER — Ambulatory Visit: Payer: Self-pay | Admitting: Family Medicine

## 2015-12-16 ENCOUNTER — Encounter: Payer: Self-pay | Admitting: Family Medicine

## 2015-12-26 ENCOUNTER — Encounter: Payer: Self-pay | Admitting: Family Medicine

## 2015-12-26 ENCOUNTER — Ambulatory Visit (INDEPENDENT_AMBULATORY_CARE_PROVIDER_SITE_OTHER): Payer: BLUE CROSS/BLUE SHIELD | Admitting: Family Medicine

## 2015-12-26 VITALS — BP 122/83 | HR 60 | Temp 98.8°F | Ht 69.0 in | Wt 222.1 lb

## 2015-12-26 DIAGNOSIS — Z1211 Encounter for screening for malignant neoplasm of colon: Secondary | ICD-10-CM

## 2015-12-26 DIAGNOSIS — I2510 Atherosclerotic heart disease of native coronary artery without angina pectoris: Secondary | ICD-10-CM

## 2015-12-26 DIAGNOSIS — E782 Mixed hyperlipidemia: Secondary | ICD-10-CM | POA: Diagnosis not present

## 2015-12-26 NOTE — Progress Notes (Signed)
BP 122/83   Pulse 60   Temp 98.8 F (37.1 C) (Oral)   Ht 5\' 9"  (1.753 m)   Wt 222 lb 2 oz (100.8 kg)   BMI 32.80 kg/m    Subjective:    Patient ID: Dennis Terry, male    DOB: 1965-06-17, 50 y.o.   MRN: [id number]  HPI: Dennis Paganhomas Corne is a 50 y.o. male presenting on 12/26/2015 for Establish Care   HPI Establish care as a new patient Patient is coming in today to establish care as a new patient to our office. He has a history of coronary artery disease and hyperlipidemia and hypertension all of which are being controlled by his cardiologist Dr. Wyline Moodbranch. His previous PCP is Dr. Denzil MagnusonNylan and he is transferring his records over to us. He has had stents twice in 2010 and 2 years ago. He does not have any angina currently but is following up with Dr. branch every 6 months. He denies any other major health issues. He is otherwise doing very well.  Relevant past medical, surgical, family and social history reviewed and updated as indicated. Interim medical history since our last visit reviewed. Allergies and medications reviewed and updated.  Review of Systems  Constitutional: Negative for chills and fever.  HENT: Negative for ear pain and tinnitus.   Eyes: Negative for pain and discharge.  Respiratory: Negative for cough, shortness of breath and wheezing.   Cardiovascular: Negative for chest pain, palpitations and leg swelling.  Gastrointestinal: Negative for abdominal pain, blood in stool, constipation and diarrhea.  Genitourinary: Negative for dysuria and hematuria.  Musculoskeletal: Negative for back pain, gait problem and myalgias.  Skin: Negative for rash.  Neurological: Negative for dizziness, weakness and headaches.  Psychiatric/Behavioral: Negative for suicidal ideas.  All other systems reviewed and are negative.   Per HPI unless specifically indicated above  Social History   Social History  . Marital status: Married    Spouse name: N/A  . Number of children: N/A  . Years  of education: N/A   Occupational History  . Not on file.   Social History Main Topics  . Smoking status: Former Smoker    Packs/day: 1.50    Years: 30.00    Types: Cigarettes    Quit date: 12/29/2008  . Smokeless tobacco: Never Used  . Alcohol use 0.0 oz/week     Comment: 10/04/2014 "none in the 2000's"  . Drug use: No  . Sexual activity: Yes     Comment: married 2017  year, been together since2001   Other Topics Concern  . Not on file   Social History Narrative  . No narrative on file    Past Surgical History:  Procedure Laterality Date  . APPENDECTOMY  ~ 2008  . CARDIAC CATHETERIZATION  2011  . CARDIAC CATHETERIZATION N/A 10/04/2014   Procedure: Left Heart Cath and Coronary Angiography;  Surgeon: Tonny BollmanMichael Cooper, MD;  [facility];  Service: Cardiovascular;  Laterality: N/A;  . CARDIAC CATHETERIZATION N/A 10/04/2014   Procedure: Coronary Stent Intervention;  Surgeon: Tonny BollmanMichael Cooper, MD;  [facility];  Service: Cardiovascular;  Laterality: N/A;  . CORONARY ANGIOPLASTY WITH STENT PLACEMENT  2010; 10/04/2014  . FINGER FRACTURE SURGERY Left ~ 2010   "middle finger"  . FOREARM FRACTURE SURGERY Left 1997  . FRACTURE SURGERY    . WRIST FRACTURE SURGERY Left 1997   w/thumb fracture    Family History  Problem Relation Age of Onset  . Heart failure Father   .  Cancer Other   . Cancer Mother     pancreatic    Allergies as of 12/26/2015      Reactions   Oxycodone-acetaminophen Shortness Of Breath, Other (See Comments)   Chest tightness and numbness    Lipitor [atorvastatin] Other (See Comments)   Muscle aches       Medication List       Accurate as of 12/26/15  9:53 AM. Always use your most recent med list.          aspirin 81 MG chewable tablet Chew 1 tablet (81 mg total) by mouth daily.   lisinopril 2.5 MG tablet Commonly known as:  PRINIVIL,ZESTRIL Take 1 tablet (2.5 mg total) by mouth daily.   metoprolol tartrate 25 MG  tablet Commonly known as:  LOPRESSOR TAKE ONE TABLET BY MOUTH TWICE DAILY   NITROSTAT 0.4 MG SL tablet Generic drug:  nitroGLYCERIN Place 1 tablet (0.4 mg total) under the tongue every 5 (five) minutes as needed for chest pain.   simvastatin 20 MG tablet Commonly known as:  ZOCOR TAKE ONE TABLET BY MOUTH ONCE DAILY          Objective:    BP 122/83   Pulse 60   Temp 98.8 F (37.1 C) (Oral)   Ht  (1.753 m)   Wt 222 lb 2 oz (100.8 kg)   BMI 32.80 kg/m   Wt Readings from Last 3 Encounters:  12/26/15 222 lb 2 oz (100.8 kg)  09/11/15 224 lb 9.6 oz (101.9 kg)  08/03/15 225 lb (102.1 kg)    Physical Exam  Constitutional: He is oriented to person, place, and time. He appears well-developed and well-nourished. No distress.  Eyes: Conjunctivae are normal. Right eye exhibits no discharge. Left eye exhibits no discharge. No scleral icterus.  Neck: Neck supple. No thyromegaly present.  Cardiovascular: Normal rate, regular rhythm, normal heart sounds and intact distal pulses.   No murmur heard. Pulmonary/Chest: Effort normal and breath sounds normal. No respiratory distress. He has no wheezes. He has no rales.  Abdominal: Soft. Bowel sounds are normal. He exhibits no distension. There is no tenderness. There is no rebound and no guarding.  Musculoskeletal: Normal range of motion. He exhibits no edema.  Lymphadenopathy:    He has no cervical adenopathy.  Neurological: He is alert and oriented to person, place, and time. Coordination normal.  Skin: Skin is warm and dry. No rash noted. He is not diaphoretic.  Psychiatric: He has a normal mood and affect. His behavior is normal.  Nursing note and vitals reviewed.     Assessment & Plan:   Problem List Items Addressed This Visit      Cardiovascular and Mediastinum   CAD, NATIVE VESSEL - Primary     Other   Hyperlipidemia    Other Visit Diagnoses    Encounter for screening colonoscopy       Relevant Orders   Ambulatory  referral to Gastroenterology       Follow up plan: Return in about 1 year (around 12/25/2016), or if symptoms worsen or fail to improve.  Arville Care, MD [facility] 12/26/2015, 9:53 AM

## 2015-12-31 ENCOUNTER — Other Ambulatory Visit: Payer: BLUE CROSS/BLUE SHIELD

## 2015-12-31 DIAGNOSIS — I2510 Atherosclerotic heart disease of native coronary artery without angina pectoris: Secondary | ICD-10-CM | POA: Diagnosis not present

## 2015-12-31 DIAGNOSIS — R739 Hyperglycemia, unspecified: Secondary | ICD-10-CM

## 2015-12-31 DIAGNOSIS — E782 Mixed hyperlipidemia: Secondary | ICD-10-CM | POA: Diagnosis not present

## 2015-12-31 NOTE — Addendum Note (Signed)
Addended by: Margorie JohnJOHNSON, [place] M on: 12/31/2015 12:55 PM   Modules accepted: Orders

## 2015-12-31 NOTE — Addendum Note (Signed)
Addended by: Quay BurowPOTTER, JANICE K on: 12/31/2015 11:35 AM   Modules accepted: Orders

## 2016-01-01 ENCOUNTER — Other Ambulatory Visit: Payer: Self-pay | Admitting: *Deleted

## 2016-01-01 DIAGNOSIS — R739 Hyperglycemia, unspecified: Secondary | ICD-10-CM

## 2016-01-01 LAB — BASIC METABOLIC PANEL
BUN/Creatinine Ratio: 14 (ref 9–20)
BUN: 12 mg/dL (ref 6–24)
CO2: 26 mmol/L (ref 18–29)
Calcium: 9.4 mg/dL (ref 8.7–10.2)
Chloride: 101 mmol/L (ref 96–106)
Creatinine, Ser: 0.83 mg/dL (ref 0.76–1.27)
GFR calc Af Amer: 118 mL/min/{1.73_m2} (ref >59)
GFR calc non Af Amer: 102 mL/min/{1.73_m2} (ref >59)
Glucose: 149 mg/dL — ABNORMAL HIGH (ref 65–99)
Potassium: 4.3 mmol/L (ref 3.5–5.2)
Sodium: 142 mmol/L (ref 134–144)

## 2016-01-02 LAB — BAYER DCA HB A1C WAIVED: HB A1C (BAYER DCA - WAIVED): 6.5 % (ref <7.0)

## 2016-01-06 ENCOUNTER — Telehealth: Payer: Self-pay | Admitting: Family Medicine

## 2016-01-07 NOTE — Telephone Encounter (Signed)
Aware. 

## 2016-01-15 ENCOUNTER — Ambulatory Visit: Payer: BLUE CROSS/BLUE SHIELD | Admitting: Pharmacist

## 2016-01-19 ENCOUNTER — Encounter: Payer: Self-pay | Admitting: Family Medicine

## 2016-01-28 ENCOUNTER — Encounter: Payer: Self-pay | Admitting: Internal Medicine

## 2016-02-02 ENCOUNTER — Ambulatory Visit (AMBULATORY_SURGERY_CENTER): Payer: Self-pay

## 2016-02-02 VITALS — Ht 69.0 in | Wt 224.0 lb

## 2016-02-02 DIAGNOSIS — Z1211 Encounter for screening for malignant neoplasm of colon: Secondary | ICD-10-CM

## 2016-02-02 MED ORDER — SUPREP BOWEL PREP KIT 17.5-3.13-1.6 GM/177ML PO SOLN: 1.0000 | Freq: Once | ORAL | 0 refills | Status: AC

## 2016-02-02 NOTE — Progress Notes (Signed)
No allergies to eggs or soy No diet meds No home oxygen No past problems with anesthesia  1st colonoscopy  Registered for emmi

## 2016-02-03 ENCOUNTER — Encounter: Payer: Self-pay | Admitting: Internal Medicine

## 2016-02-16 ENCOUNTER — Encounter: Payer: BLUE CROSS/BLUE SHIELD | Admitting: Internal Medicine

## 2016-03-11 DIAGNOSIS — K625 Hemorrhage of anus and rectum: Secondary | ICD-10-CM | POA: Diagnosis not present

## 2016-03-11 DIAGNOSIS — Z1211 Encounter for screening for malignant neoplasm of colon: Secondary | ICD-10-CM | POA: Diagnosis not present

## 2016-04-12 DIAGNOSIS — Z1211 Encounter for screening for malignant neoplasm of colon: Secondary | ICD-10-CM | POA: Diagnosis not present

## 2016-04-12 DIAGNOSIS — D123 Benign neoplasm of transverse colon: Secondary | ICD-10-CM | POA: Diagnosis not present

## 2016-04-12 DIAGNOSIS — K621 Rectal polyp: Secondary | ICD-10-CM | POA: Diagnosis not present

## 2016-04-12 DIAGNOSIS — K635 Polyp of colon: Secondary | ICD-10-CM | POA: Diagnosis not present

## 2016-04-12 DIAGNOSIS — D120 Benign neoplasm of cecum: Secondary | ICD-10-CM | POA: Diagnosis not present

## 2016-04-12 DIAGNOSIS — D127 Benign neoplasm of rectosigmoid junction: Secondary | ICD-10-CM | POA: Diagnosis not present

## 2016-05-17 ENCOUNTER — Ambulatory Visit: Payer: BLUE CROSS/BLUE SHIELD | Admitting: Cardiology

## 2016-05-19 ENCOUNTER — Ambulatory Visit (INDEPENDENT_AMBULATORY_CARE_PROVIDER_SITE_OTHER): Payer: BLUE CROSS/BLUE SHIELD | Admitting: Cardiology

## 2016-05-19 ENCOUNTER — Telehealth: Payer: Self-pay | Admitting: Family Medicine

## 2016-05-19 ENCOUNTER — Encounter: Payer: Self-pay | Admitting: Cardiology

## 2016-05-19 VITALS — BP 125/78 | HR 58 | Ht 69.0 in | Wt 226.2 lb

## 2016-05-19 DIAGNOSIS — I2510 Atherosclerotic heart disease of native coronary artery without angina pectoris: Secondary | ICD-10-CM | POA: Diagnosis not present

## 2016-05-19 DIAGNOSIS — E782 Mixed hyperlipidemia: Secondary | ICD-10-CM | POA: Diagnosis not present

## 2016-05-19 NOTE — Progress Notes (Signed)
Clinical Summary Dennis Terry is a 51 y.o.male seen today for follow up of the following medical problems.   1. CAD - previous NSTEMI 12/2008 where he received a DES to RCA - admit 10/2014 with chest pain, trop up to 0.08. LHC showed RCA disease again that received a DES. Normal LVEF by LVgram during that admit. 2010 echo LVEF 55% with moderate to severe inferior hypokinesis.    - no recent chest pain, no SOB or DOE. Walks 2 miles regularly.  - compliant with meds.   2. Hyperlipidemia - muscle aches on lipitor and crestor. Notes mild aches on low dose simva but tolerable. Atypical for statin induced myalgias as mainly involve his feet after standing or working long periods of time.  - currently on atorva 80mg  daily, tolerating. Still with foot pains, unclear if related.   3. Skin rash - rash on skin since changing atorvastatin and lisinopril. Seems mainly to develop when out in the sunlight.      SH: works in Designer, fashion/clothing, Special educational needs teacher.    Past Medical History:  Diagnosis Date  . CAD (coronary artery disease), native coronary artery   . Hyperlipidemia   . Myocardial infarction 12/29/2008  . Pneumonia 02/2014   "not sure if it was pneumonia or flu"     Allergies  Allergen Reactions  . Oxycodone-Acetaminophen Shortness Of Breath and Other (See Comments)    Chest tightness and numbness   . Lipitor [Atorvastatin] Other (See Comments)    Muscle aches      Current Outpatient Prescriptions  Medication Sig Dispense Refill  . aspirin 81 MG chewable tablet Chew 1 tablet (81 mg total) by mouth daily.    Marland Kitchen lisinopril (PRINIVIL,ZESTRIL) 2.5 MG tablet Take 1 tablet (2.5 mg total) by mouth daily. 90 tablet 3  . metoprolol tartrate (LOPRESSOR) 25 MG tablet TAKE ONE TABLET BY MOUTH TWICE DAILY 60 tablet 11  . NITROSTAT 0.4 MG SL tablet Place 1 tablet (0.4 mg total) under the tongue every 5 (five) minutes as needed for chest pain. 25 tablet 4  . simvastatin (ZOCOR) 20 MG tablet TAKE  ONE TABLET BY MOUTH ONCE DAILY 30 tablet 11   No current facility-administered medications for this visit.      Past Surgical History:  Procedure Laterality Date  . APPENDECTOMY  ~ 2008  . CARDIAC CATHETERIZATION  2011  . CARDIAC CATHETERIZATION N/A 10/04/2014   Procedure: Left Heart Cath and Coronary Angiography;  Surgeon: Tonny Bollman, MD;  [facility] INVASIVE CV LAB;  Service: Cardiovascular;  Laterality: N/A;  . CARDIAC CATHETERIZATION N/A 10/04/2014   Procedure: Coronary Stent Intervention;  Surgeon: Tonny Bollman, MD;  Location: [facility] INVASIVE CV LAB;  Service: Cardiovascular;  Laterality: N/A;  . CORONARY ANGIOPLASTY WITH STENT PLACEMENT  2010; 10/04/2014  . FINGER FRACTURE SURGERY Left ~ 2010   "middle finger"  . FOREARM FRACTURE SURGERY Left 1997  . FRACTURE SURGERY    . WRIST FRACTURE SURGERY Left 1997   w/thumb fracture     Allergies  Allergen Reactions  . Oxycodone-Acetaminophen Shortness Of Breath and Other (See Comments)    Chest tightness and numbness   . Lipitor [Atorvastatin] Other (See Comments)    Muscle aches       Family History  Problem Relation Age of Onset  . Heart failure Father   . Cancer Other   . Cancer Mother        pancreatic  . Colon cancer Neg Hx      Social  History Dennis Terry reports that he quit smoking about 7 years ago. His smoking use included Cigarettes. He has a 45.00 pack-year smoking history. He has never used smokeless tobacco. Dennis Terry reports that he does not drink alcohol.   Review of Systems CONSTITUTIONAL: No weight loss, fever, chills, weakness or fatigue.  HEENT: Eyes: No visual loss, blurred vision, double vision or yellow sclerae.No hearing loss, sneezing, congestion, runny nose or sore throat.  SKIN: No rash or itching.  CARDIOVASCULAR:per hpi  RESPIRATORY: No shortness of breath, cough or sputum.  GASTROINTESTINAL: No anorexia, nausea, vomiting or diarrhea. No abdominal pain or blood.  GENITOURINARY: No  burning on urination, no polyuria NEUROLOGICAL: No headache, dizziness, syncope, paralysis, ataxia, numbness or tingling in the extremities. No change in bowel or bladder control.  MUSCULOSKELETAL:feet pain LYMPHATICS: No enlarged nodes. No history of splenectomy.  PSYCHIATRIC: No history of depression or anxiety.  ENDOCRINOLOGIC: No reports of sweating, cold or heat intolerance. No polyuria or polydipsia.  Marland Kitchen.   Physical Examination Vitals:   05/19/16 1301  BP: 125/78  Pulse: (!) 58   Vitals:   05/19/16 1301  Weight: 226 lb 3.2 oz (102.6 kg)  Height: 5\' 9"  (1.753 m)    Gen: resting comfortably, no acute distress HEENT: no scleral icterus, pupils equal round and reactive, no palptable cervical adenopathy,  CV: RRR, no m/r/g, no jvd Resp: Clear to auscultation bilaterally GI: abdomen is soft, non-tender, non-distended, normal bowel sounds, no hepatosplenomegaly MSK: extremities are warm, no edema.  Skin: warm, no rash Neuro:  no focal deficits Psych: appropriate affect   Diagnostic Studies 09/2014 Cath  Mid RCA lesion, 90% stenosed.  Mid LAD lesion, 40% stenosed.  The left ventricular systolic function is normal.  Prox RCA lesion, 60% stenosed. There is a 0% residual stenosis post intervention. The lesion was previously treated with a stent (unknown type) .  A drug-eluting stent was placed.  1. Severe single vessel CAD involving the RCA treated successfully with PTCA and stenting (DES) 2. Mild nonobstructive disease of the LAD 3. Widely patent LCx 4. Preserved overall LV function  Continue DAPT with ASA and Effient x 12 months as tolerated    Assessment and Plan  1. CAD - s/p NSTEMI 10/2014 with DES to RCA placed - no current symptoms. EKG in clinic shows SR, no ischemic changes - rash brought on with sunlight, unclear if related to starting lisinopril. Will hold x 1 month, he will update us on symptoms  2. Hyperlipidemia - nonspecific foot pain, unclear if  related to statin. We will continue at this time - repeat lipid panel     F/u 1 year      Antoine PocheJonathan F. Millenia Waldvogel, M.D., F.A.C.C.

## 2016-05-19 NOTE — Telephone Encounter (Signed)
Left detailed message regarding appt

## 2016-05-19 NOTE — Patient Instructions (Signed)
Your physician wants you to follow-up in: 1 YEAR [facility] You will receive a reminder letter in the mail two months in advance. If you don't receive a letter, please call our office to schedule the follow-up appointment.  Your physician has recommended you make the following change in your medication:   HOLD LISINOPRIL FOR 1 MONTH AND CALL US WITH AN UPDATE   Your physician recommends that you return for lab work - PLEASE FAST 6-8 HOURS PRIOR TO LAB WORK LIPIDS  Thank you for choosing [facility]!!

## 2016-05-19 NOTE — Telephone Encounter (Signed)
Please advise and order labs if approved.

## 2016-05-19 NOTE — Telephone Encounter (Signed)
We can go ahead and put in a chem panel and a lipid, I would recommend for the patient to come in and discuss the results a week after getting the labs drawn.

## 2016-05-20 ENCOUNTER — Other Ambulatory Visit: Payer: BLUE CROSS/BLUE SHIELD

## 2016-05-20 ENCOUNTER — Other Ambulatory Visit: Payer: Self-pay

## 2016-05-20 DIAGNOSIS — E7800 Pure hypercholesterolemia, unspecified: Secondary | ICD-10-CM

## 2016-05-20 DIAGNOSIS — I2510 Atherosclerotic heart disease of native coronary artery without angina pectoris: Secondary | ICD-10-CM | POA: Diagnosis not present

## 2016-05-20 LAB — BAYER DCA HB A1C WAIVED: HB A1C (BAYER DCA - WAIVED): 6.1 % (ref <7.0)

## 2016-05-21 LAB — CMP14+EGFR
ALT: 26 IU/L (ref 0–44)
AST: 24 IU/L (ref 0–40)
Albumin/Globulin Ratio: 1.4 (ref 1.2–2.2)
Albumin: 4 g/dL (ref 3.5–5.5)
Alkaline Phosphatase: 80 IU/L (ref 39–117)
BUN/Creatinine Ratio: 14 (ref 9–20)
BUN: 11 mg/dL (ref 6–24)
Bilirubin Total: 0.3 mg/dL (ref 0.0–1.2)
CO2: 25 mmol/L (ref 18–29)
Calcium: 9 mg/dL (ref 8.7–10.2)
Chloride: 100 mmol/L (ref 96–106)
Creatinine, Ser: 0.81 mg/dL (ref 0.76–1.27)
GFR calc Af Amer: 118 mL/min/{1.73_m2} (ref >59)
GFR calc non Af Amer: 102 mL/min/{1.73_m2} (ref >59)
Globulin, Total: 2.8 g/dL (ref 1.5–4.5)
Glucose: 137 mg/dL — ABNORMAL HIGH (ref 65–99)
Potassium: 4.2 mmol/L (ref 3.5–5.2)
Sodium: 141 mmol/L (ref 134–144)
Total Protein: 6.8 g/dL (ref 6.0–8.5)

## 2016-05-21 LAB — LIPID PANEL
Chol/HDL Ratio: 4.7 ratio (ref 0.0–5.0)
Cholesterol, Total: 146 mg/dL (ref 100–199)
HDL: 31 mg/dL — ABNORMAL LOW (ref >39)
LDL Calculated: 78 mg/dL (ref 0–99)
Triglycerides: 186 mg/dL — ABNORMAL HIGH (ref 0–149)
VLDL Cholesterol Cal: 37 mg/dL (ref 5–40)

## 2016-05-24 ENCOUNTER — Encounter: Payer: Self-pay | Admitting: Family Medicine

## 2016-05-24 ENCOUNTER — Ambulatory Visit (INDEPENDENT_AMBULATORY_CARE_PROVIDER_SITE_OTHER): Payer: BLUE CROSS/BLUE SHIELD | Admitting: Family Medicine

## 2016-05-24 ENCOUNTER — Ambulatory Visit: Payer: BLUE CROSS/BLUE SHIELD | Admitting: Family Medicine

## 2016-05-24 VITALS — BP 123/82 | HR 64 | Temp 98.5°F | Ht 69.0 in | Wt 222.0 lb

## 2016-05-24 DIAGNOSIS — M722 Plantar fascial fibromatosis: Secondary | ICD-10-CM

## 2016-05-24 DIAGNOSIS — R7303 Prediabetes: Secondary | ICD-10-CM | POA: Diagnosis not present

## 2016-05-24 DIAGNOSIS — E7800 Pure hypercholesterolemia, unspecified: Secondary | ICD-10-CM | POA: Diagnosis not present

## 2016-05-24 NOTE — Progress Notes (Signed)
BP 123/82   Pulse 64   Temp 98.5 F (36.9 C) (Oral)   Ht 5' 9" (1.753 m)   Wt 222 lb (100.7 kg)   BMI 32.78 kg/m    Subjective:    Patient ID: Dennis Terry, male    DOB: [birth year], 51 y.o.   MRN: [id number]  HPI: Dennis Terry is a 51 y.o. male presenting on 05/24/2016 for 6 month followup (discuss labs) and Feet pain (bilateral, burning and painful, heel pain in right foot;  cardiologist recommended last week he discontinue Lisinopril and see if that was causing it)   HPI Hyperlipidemia Patient is coming in for recheck of his hyperlipidemia. He is currently taking Atorvastatin. He denies any issues with myalgias or history of liver damage from it. He denies any focal numbness or weakness or chest pain.   Prediabetes Patient comes in today for recheck of his diabetes. Patient has been currently taking dietary control. Patient is currently on an ACE inhibitor but has not been taking it because his cardiologist recommended that he discontinue it because of a possible reaction that they are watching for. Patient has not seen an ophthalmologist this year. Patient is having issues with his feet that he describes as a burning pain near the heel and on the bottom of his feet, worse on the right foot than the left. He denies any numbness or sore. His foot pain has been bothering him more over the past month and has been increasing over the past month. He says that his feet are not hurting today and he has been trying to roll in his feet over things to help with the pressure points.  Relevant past medical, surgical, family and social history reviewed and updated as indicated. Interim medical history since our last visit reviewed. Allergies and medications reviewed and updated.  Review of Systems  Constitutional: Negative for chills and fever.  Eyes: Negative for discharge.  Respiratory: Negative for shortness of breath and wheezing.   Cardiovascular: Negative for chest pain and leg swelling.    Musculoskeletal: Positive for arthralgias. Negative for back pain and gait problem.  Skin: Negative for rash.  Neurological: Negative for dizziness, light-headedness and numbness.  All other systems reviewed and are negative.   Per HPI unless specifically indicated above        Objective:    BP 123/82   Pulse 64   Temp 98.5 F (36.9 C) (Oral)   Ht 5' 9" (1.753 m)   Wt 222 lb (100.7 kg)   BMI 32.78 kg/m   Wt Readings from Last 3 Encounters:  05/24/16 222 lb (100.7 kg)  05/19/16 226 lb 3.2 oz (102.6 kg)  02/02/16 224 lb (101.6 kg)    Physical Exam  Constitutional: He is oriented to person, place, and time. He appears well-developed and well-nourished. No distress.  Eyes: Conjunctivae are normal. No scleral icterus.  Neck: Neck supple. No thyromegaly present.  Cardiovascular: Normal rate, regular rhythm, normal heart sounds and intact distal pulses.   No murmur heard. Pulmonary/Chest: Effort normal and breath sounds normal. No respiratory distress. He has no wheezes. He has no rales.  Musculoskeletal: Normal range of motion. He exhibits no edema or tenderness (No tenderness in either of his feet today on exam, range of motion and sensation and capillary refill intact.).  Lymphadenopathy:    He has no cervical adenopathy.  Neurological: He is alert and oriented to person, place, and time. Coordination normal.  Skin: Skin is warm and dry. No  rash noted. He is not diaphoretic.  Psychiatric: He has a normal mood and affect. His behavior is normal.  Nursing note and vitals reviewed.   Results for orders placed or performed in visit on 05/20/16  CMP14+EGFR  Result Value Ref Range   Glucose 137 (H) 65 - 99 mg/dL   BUN 11 6 - 24 mg/dL   Creatinine, Ser 0.81 0.76 - 1.27 mg/dL   GFR calc non Af Amer 102 >59 mL/min/1.73   GFR calc Af Amer 118 >59 mL/min/1.73   BUN/Creatinine Ratio 14 9 - 20   Sodium 141 134 - 144 mmol/L   Potassium 4.2 3.5 - 5.2 mmol/L   Chloride 100 96 -  106 mmol/L   CO2 25 18 - 29 mmol/L   Calcium 9.0 8.7 - 10.2 mg/dL   Total Protein 6.8 6.0 - 8.5 g/dL   Albumin 4.0 3.5 - 5.5 g/dL   Globulin, Total 2.8 1.5 - 4.5 g/dL   Albumin/Globulin Ratio 1.4 1.2 - 2.2   Bilirubin Total 0.3 0.0 - 1.2 mg/dL   Alkaline Phosphatase 80 39 - 117 IU/L   AST 24 0 - 40 IU/L   ALT 26 0 - 44 IU/L  Bayer DCA Hb A1c Waived  Result Value Ref Range   Bayer DCA Hb A1c Waived 6.1 <7.0 %  Lipid panel  Result Value Ref Range   Cholesterol, Total 146 100 - 199 mg/dL   Triglycerides 186 (H) 0 - 149 mg/dL   HDL 31 (L) >39 mg/dL   VLDL Cholesterol Cal 37 5 - 40 mg/dL   LDL Calculated 78 0 - 99 mg/dL   Chol/HDL Ratio 4.7 0.0 - 5.0 ratio      Assessment & Plan:   Problem List Items Addressed This Visit      Other   Hyperlipidemia - Primary   Prediabetes    Other Visit Diagnoses    Plantar fasciitis, bilateral       Not hurting today but recommended ice and inserts for feet       Follow up plan: Return in about 6 months (around 11/24/2016), or if symptoms worsen or fail to improve, for Recheck cholesterol and diabetes.  Counseling provided for all of the vaccine components No orders of the defined types were placed in this encounter.   Joshua Dettinger, MD [facility] 05/24/2016, 1:36 PM     

## 2016-07-01 ENCOUNTER — Telehealth: Payer: Self-pay | Admitting: *Deleted

## 2016-07-01 NOTE — Telephone Encounter (Signed)
Please have him come in for a BP check to evaluate his BP prior to starting something new.

## 2016-07-01 NOTE — Telephone Encounter (Signed)
Pt f/u from holding lisinopril for the last month per LOV with Dr Wyline MoodBranch - says rash has cleared up and pain in feet has subsided - wants to know if he should be taking something else for BP control (doesnt have a way to check BP at home) or just continue to remain off lisinopril

## 2016-07-02 NOTE — Telephone Encounter (Signed)
Pt scheduled for 7/2 Monday for nurse BP check

## 2016-07-02 NOTE — Telephone Encounter (Signed)
LM to return call.

## 2016-07-05 ENCOUNTER — Ambulatory Visit (INDEPENDENT_AMBULATORY_CARE_PROVIDER_SITE_OTHER): Payer: BLUE CROSS/BLUE SHIELD | Admitting: *Deleted

## 2016-07-05 VITALS — BP 114/68 | HR 60

## 2016-07-05 DIAGNOSIS — I2510 Atherosclerotic heart disease of native coronary artery without angina pectoris: Secondary | ICD-10-CM

## 2016-07-05 MED ORDER — LOSARTAN POTASSIUM 25 MG PO TABS: 12.5000 mg | ORAL_TABLET | Freq: Every day | ORAL | 3 refills | Status: DC

## 2016-07-05 NOTE — Progress Notes (Signed)
I would try starting losartan 12.5mg  daily. This is not so much for his blood pressure which actually looks ok, but the other benefits for this medication primarily in lowering the risk of recurrent blockages/heart attacks/strokes. Will need BMET in 2 weeks after starting   J Zhaniya Swallows MD

## 2016-07-05 NOTE — Progress Notes (Signed)
Patient notified.  [facility]  [place].  Will mail order to home & he will do at his pmd office (needs [facility] order).

## 2016-07-05 NOTE — Progress Notes (Signed)
Patient in office this morning for BP check.  His Lisinopril has been on hold.  Did take his Lopressor around 7:30 this morning.

## 2016-07-23 ENCOUNTER — Telehealth: Payer: Self-pay | Admitting: Cardiology

## 2016-07-23 ENCOUNTER — Other Ambulatory Visit: Payer: BLUE CROSS/BLUE SHIELD

## 2016-07-23 DIAGNOSIS — I2510 Atherosclerotic heart disease of native coronary artery without angina pectoris: Secondary | ICD-10-CM | POA: Diagnosis not present

## 2016-07-23 NOTE — Telephone Encounter (Signed)
Error/tg °

## 2016-07-24 LAB — BASIC METABOLIC PANEL
BUN/Creatinine Ratio: 18 (ref 9–20)
BUN: 13 mg/dL (ref 6–24)
CO2: 21 mmol/L (ref 20–29)
Calcium: 8.8 mg/dL (ref 8.7–10.2)
Chloride: 105 mmol/L (ref 96–106)
Creatinine, Ser: 0.74 mg/dL — ABNORMAL LOW (ref 0.76–1.27)
GFR calc Af Amer: 123 mL/min/{1.73_m2} (ref >59)
GFR calc non Af Amer: 106 mL/min/{1.73_m2} (ref >59)
Glucose: 144 mg/dL — ABNORMAL HIGH (ref 65–99)
Potassium: 4.2 mmol/L (ref 3.5–5.2)
Sodium: 143 mmol/L (ref 134–144)

## 2016-07-27 ENCOUNTER — Telehealth: Payer: Self-pay | Admitting: *Deleted

## 2016-07-27 NOTE — Telephone Encounter (Signed)
Pt sister (Amy DPR) made aware - routed to pcp

## 2016-07-27 NOTE — Telephone Encounter (Signed)
-----   Message from Antoine PocheJonathan F Branch, MD sent at 07/27/2016 12:31 PM EDT ----- Labs look good, no changes  Dominga FerryJ Branch MD

## 2016-07-30 IMAGING — CR DG CHEST 2V
2 series · 2 of 2 positions shown · non-contrast
Comparison: 01/08/2010

CLINICAL DATA: Left chest pain. Recent cold. Stent in 1000. Former
smoker.

EXAM:
CHEST  2 VIEW

[chest pa]
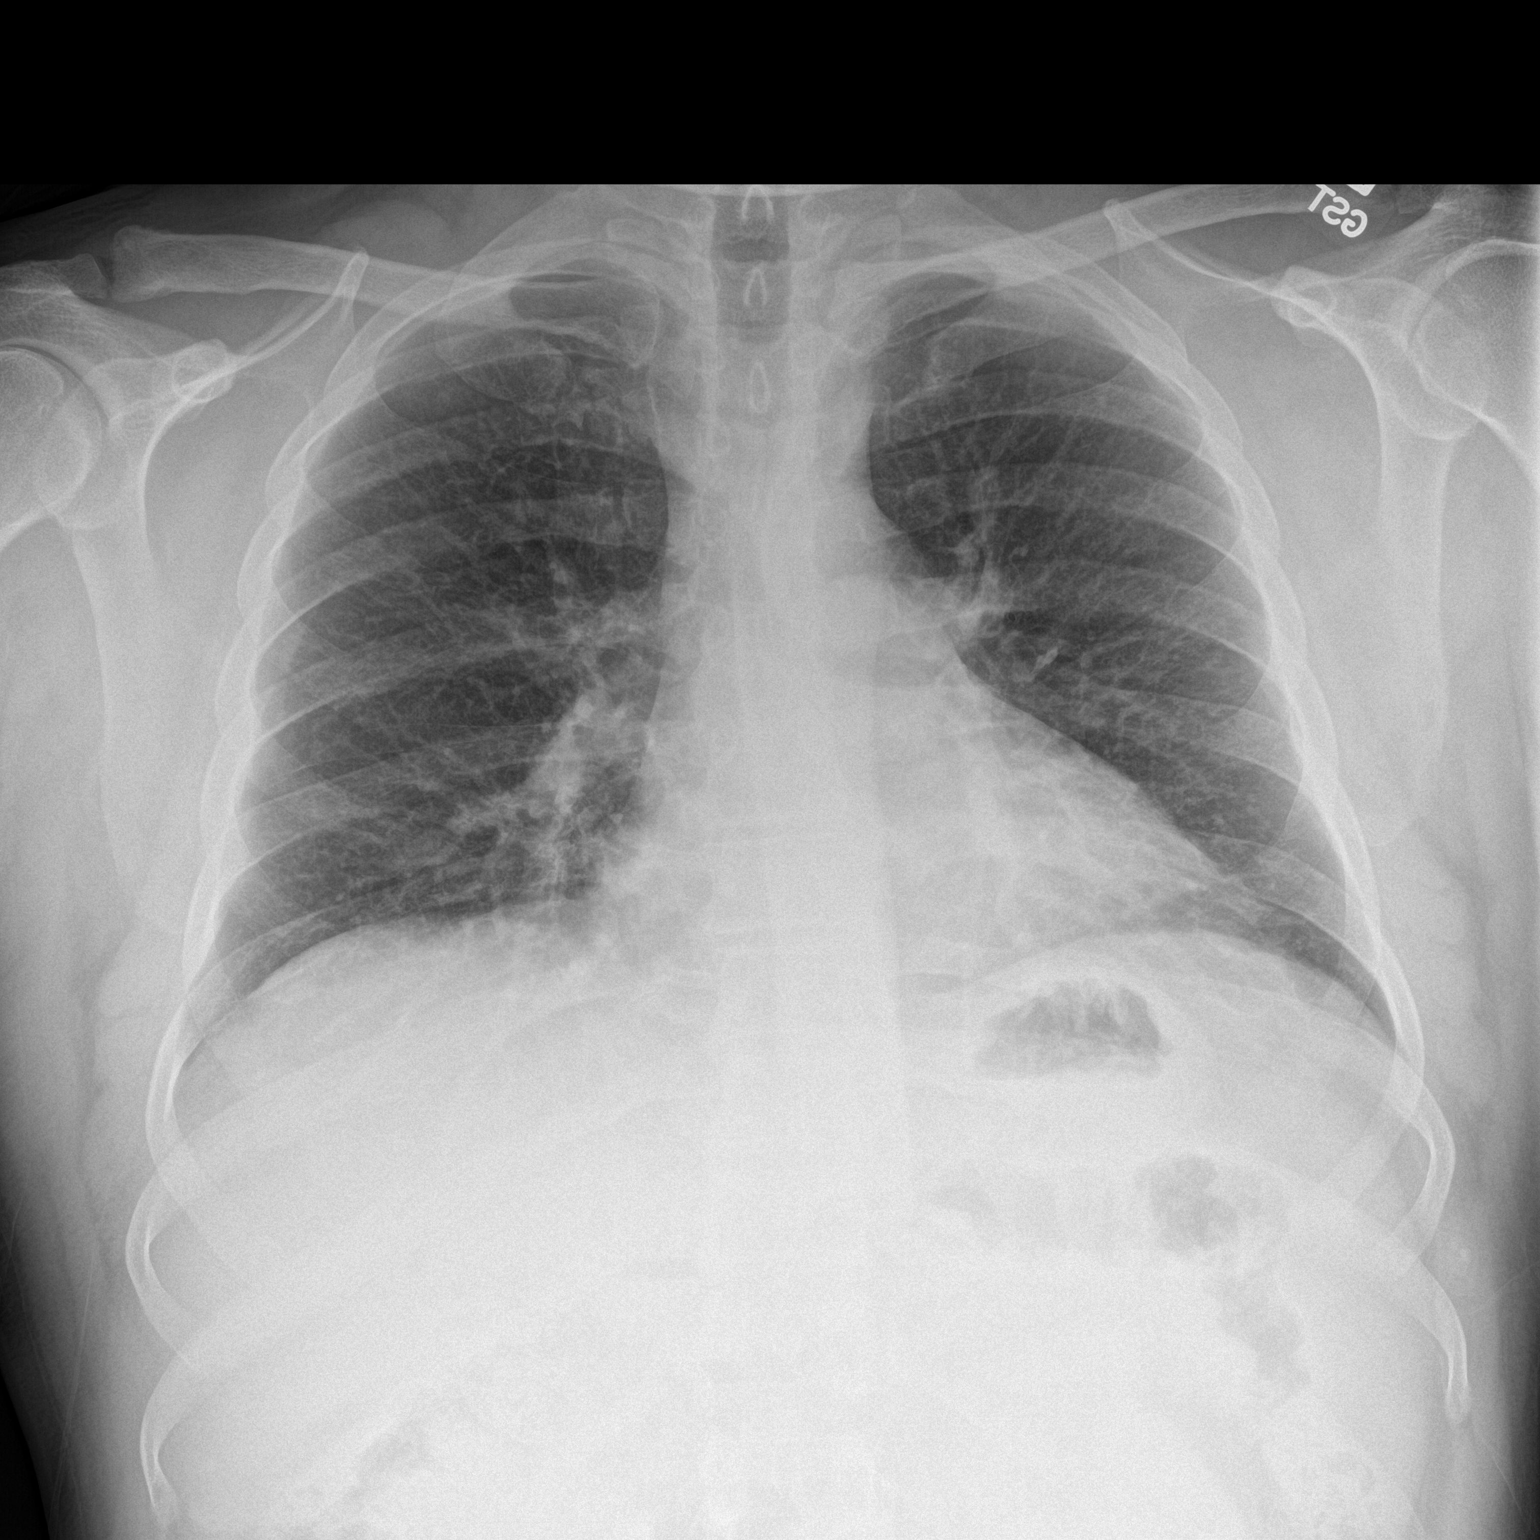

[chest lat]
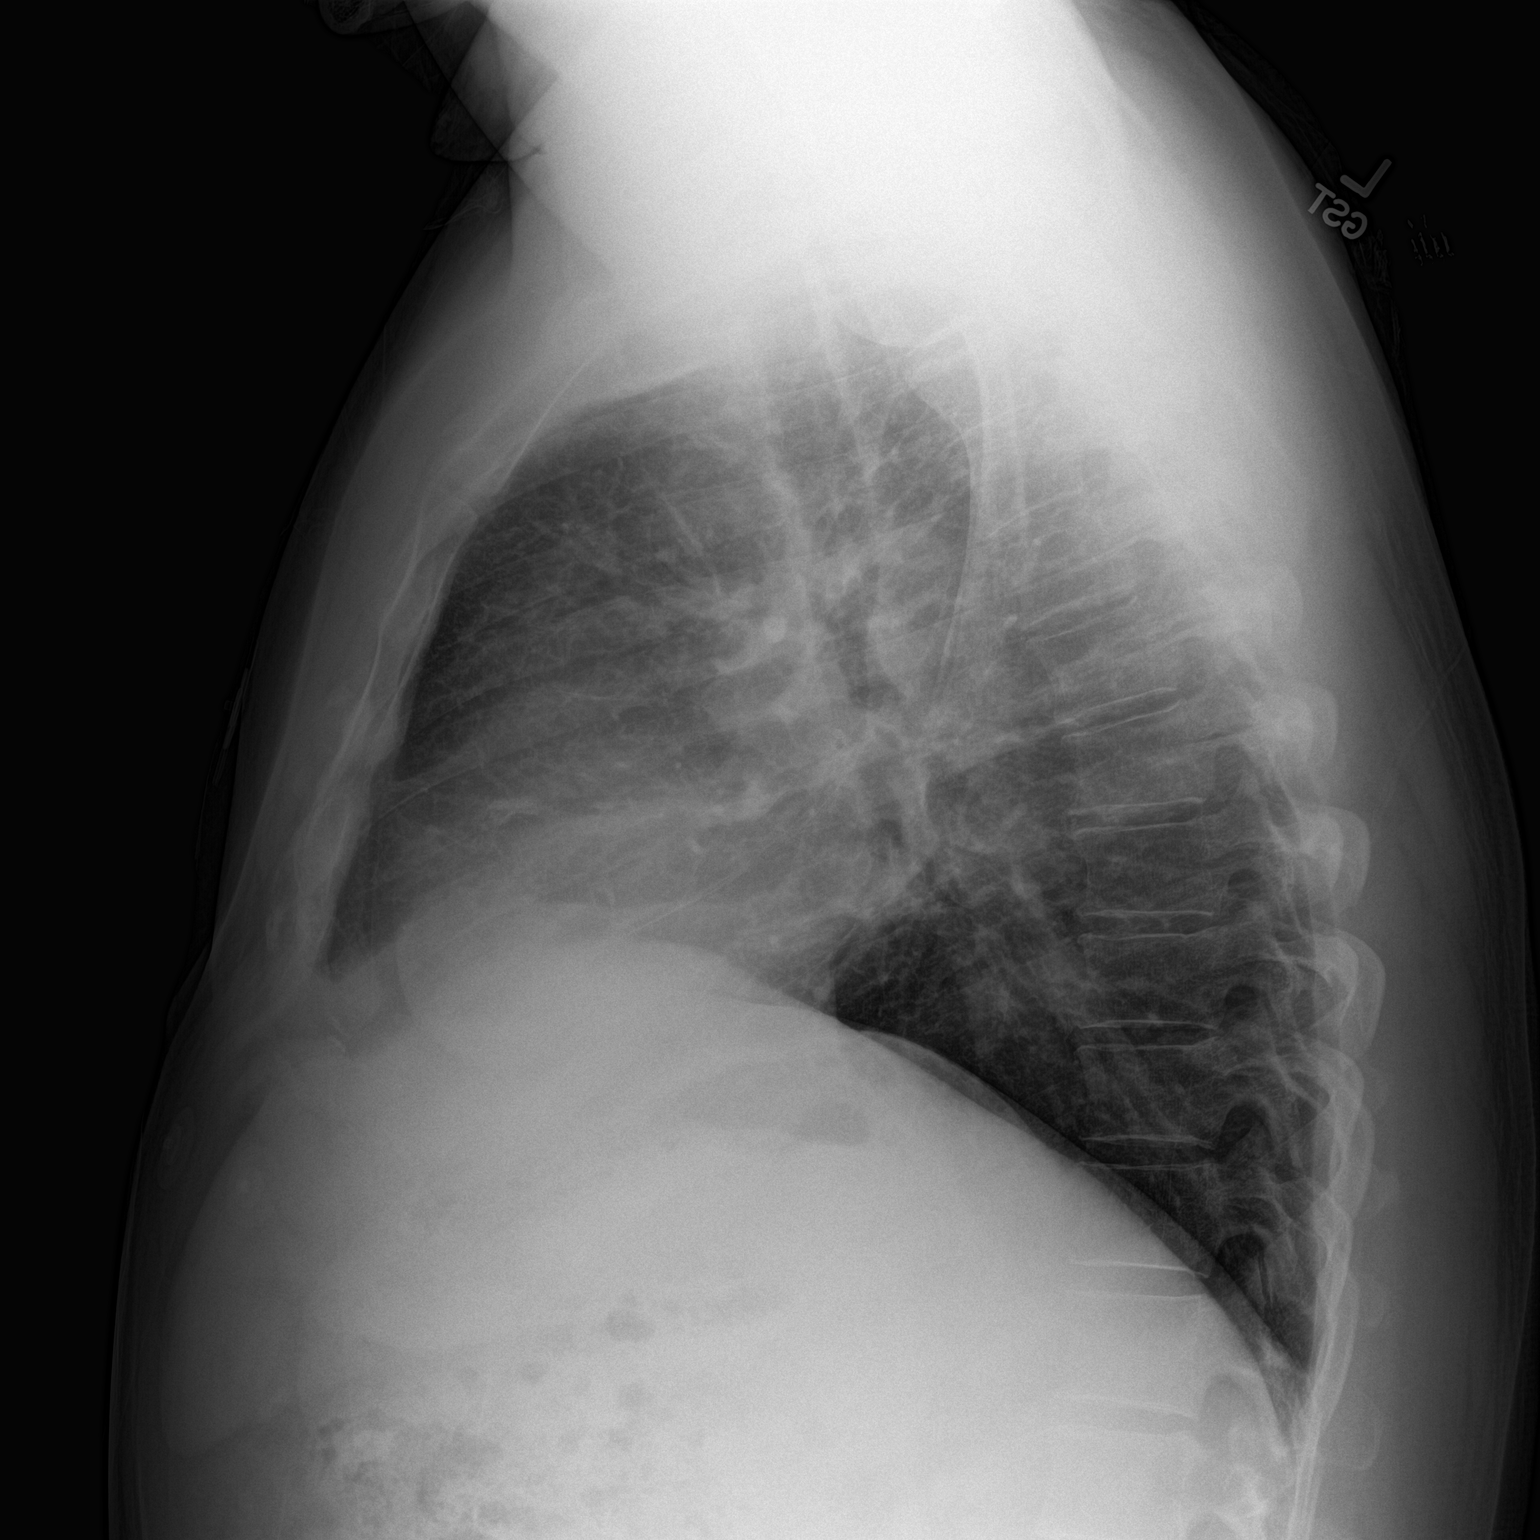

[2 of 2 positions shown; findings below may reference images not displayed]

FINDINGS: Shallow inspiration. Normal heart size and pulmonary vascularity.
Central interstitial changes suggesting chronic bronchitis. No focal
airspace disease or consolidation. No blunting of costophrenic
angles. No pneumothorax.
IMPRESSION: Chronic bronchitic changes. No evidence of active pulmonary disease.

## 2016-09-10 ENCOUNTER — Ambulatory Visit (INDEPENDENT_AMBULATORY_CARE_PROVIDER_SITE_OTHER): Payer: BLUE CROSS/BLUE SHIELD | Admitting: Family Medicine

## 2016-09-10 ENCOUNTER — Encounter: Payer: Self-pay | Admitting: Family Medicine

## 2016-09-10 VITALS — BP 118/69 | HR 60 | Temp 97.4°F | Ht 69.0 in | Wt 226.0 lb

## 2016-09-10 DIAGNOSIS — H60332 Swimmer's ear, left ear: Secondary | ICD-10-CM

## 2016-09-10 MED ORDER — AMOXICILLIN 875 MG PO TABS: 875.0000 mg | ORAL_TABLET | Freq: Two times a day (BID) | ORAL | 0 refills | Status: DC

## 2016-09-10 MED ORDER — CIPROFLOXACIN-DEXAMETHASONE 0.3-0.1 % OT SUSP: 4.0000 [drp] | Freq: Two times a day (BID) | OTIC | 0 refills | Status: DC

## 2016-09-10 NOTE — Progress Notes (Signed)
   HPI  Patient presents today here with left ear pain.  Patient had symptoms for approximately 24 hours. Difficulty sleeping last night due to pain. He states that he also has change in hearing with sounds like he's in a drum. He denies fever, chills, sweats. He did have some mild low back pain last night.  He's tolerating food and fluids left usual.  PMH: Smoking status noted ROS: Per HPI  Objective: BP 118/69   Pulse 60   Temp (!) 97.4 F (36.3 C) (Oral)   Ht 5\' 9"  (1.753 m)   Wt 226 lb (102.5 kg)   BMI 33.37 kg/m  Gen: NAD, alert, cooperative with exam HEENT: NCAT, tenderness and maceration of the left external ear canal, left TM only partially visualized with apparent effusion and erythema, right TM within normal limits, oropharynx moist and clear CV: RRR, good S1/S2, no murmur Resp: CTABL, no wheezes, non-labored Ext: No edema, warm Neuro: Alert and oriented, No gross deficits  Assessment and plan:  # Swimmer's ear With possible underlying otitis media, however difficult to be sure with my exam, limited exam due to tenderness and pain. Treat with Ciprodex plus amoxicillin  Meds ordered this encounter  Medications  . ciprofloxacin-dexamethasone (CIPRODEX) OTIC suspension    Sig: Place 4 drops into the left ear 2 (two) times daily.    Dispense:  7.5 mL    Refill:  0  . amoxicillin (AMOXIL) 875 MG tablet    Sig: Take 1 tablet (875 mg total) by mouth 2 (two) times daily.    Dispense:  20 tablet    Refill:  0    Murtis SinkSam Glenmore Karl, MD [facility] 09/10/2016, 9:34 AM

## 2016-09-10 NOTE — Patient Instructions (Signed)
Great to see you!  The drops treat the external ear and I have also prescribed an antibiotic by mouth to help with anything brewing in the inner ear.   Use the drops for 5-10 days.    Otitis Externa Otitis externa is an infection of the outer ear canal. The outer ear canal is the area between the outside of the ear and the eardrum. Otitis externa is sometimes called "swimmer's ear." Follow these instructions at home:  If you were given antibiotic ear drops, use them as told by your doctor. Do not stop using them even if your condition gets better.  Take over-the-counter and prescription medicines only as told by your doctor.  Keep all follow-up visits as told by your doctor. This is important. How is this prevented?  Keep your ear dry. Use the corner of a towel to dry your ear after you swim or bathe.  Try not to scratch or put things in your ear. Doing these things makes it easier for germs to grow in your ear.  Avoid swimming in lakes, dirty water, or pools that may not have the right amount of a chemical called chlorine.  Consider making ear drops and putting 3 or 4 drops in each ear after you swim. Ask your doctor about how you can make ear drops. Contact a doctor if:  You have a fever.  After 3 days your ear is still red, swollen, or painful.  After 3 days you still have pus coming from your ear.  Your redness, swelling, or pain gets worse.  You have a really bad headache.  You have redness, swelling, pain, or tenderness behind your ear. This information is not intended to replace advice given to you by your health care provider. Make sure you discuss any questions you have with your health care provider. Document Released: 06/09/2007 Document Revised: 01/16/2015 Document Reviewed: 09/30/2014 Elsevier Interactive Patient Education  [employer]2018 Elsevier Inc.

## 2016-11-13 ENCOUNTER — Other Ambulatory Visit: Payer: Self-pay | Admitting: Cardiology

## 2016-11-15 ENCOUNTER — Other Ambulatory Visit: Payer: Self-pay | Admitting: Cardiology

## 2016-11-16 ENCOUNTER — Other Ambulatory Visit: Payer: Self-pay

## 2016-11-16 MED ORDER — SIMVASTATIN 20 MG PO TABS: 20.0000 mg | ORAL_TABLET | Freq: Every day | ORAL | 3 refills | Status: DC

## 2017-02-15 ENCOUNTER — Other Ambulatory Visit: Payer: Self-pay | Admitting: Cardiology

## 2017-03-06 IMAGING — CR DG CHEST 1V PORT
1 series · 1 of 1 positions shown · non-contrast
Comparison: None.

CLINICAL DATA: Acute onset chest pain and shortness of breath for 3
hours. Myocardial infarct.

EXAM:
PORTABLE CHEST 1 VIEW

[ap portable]
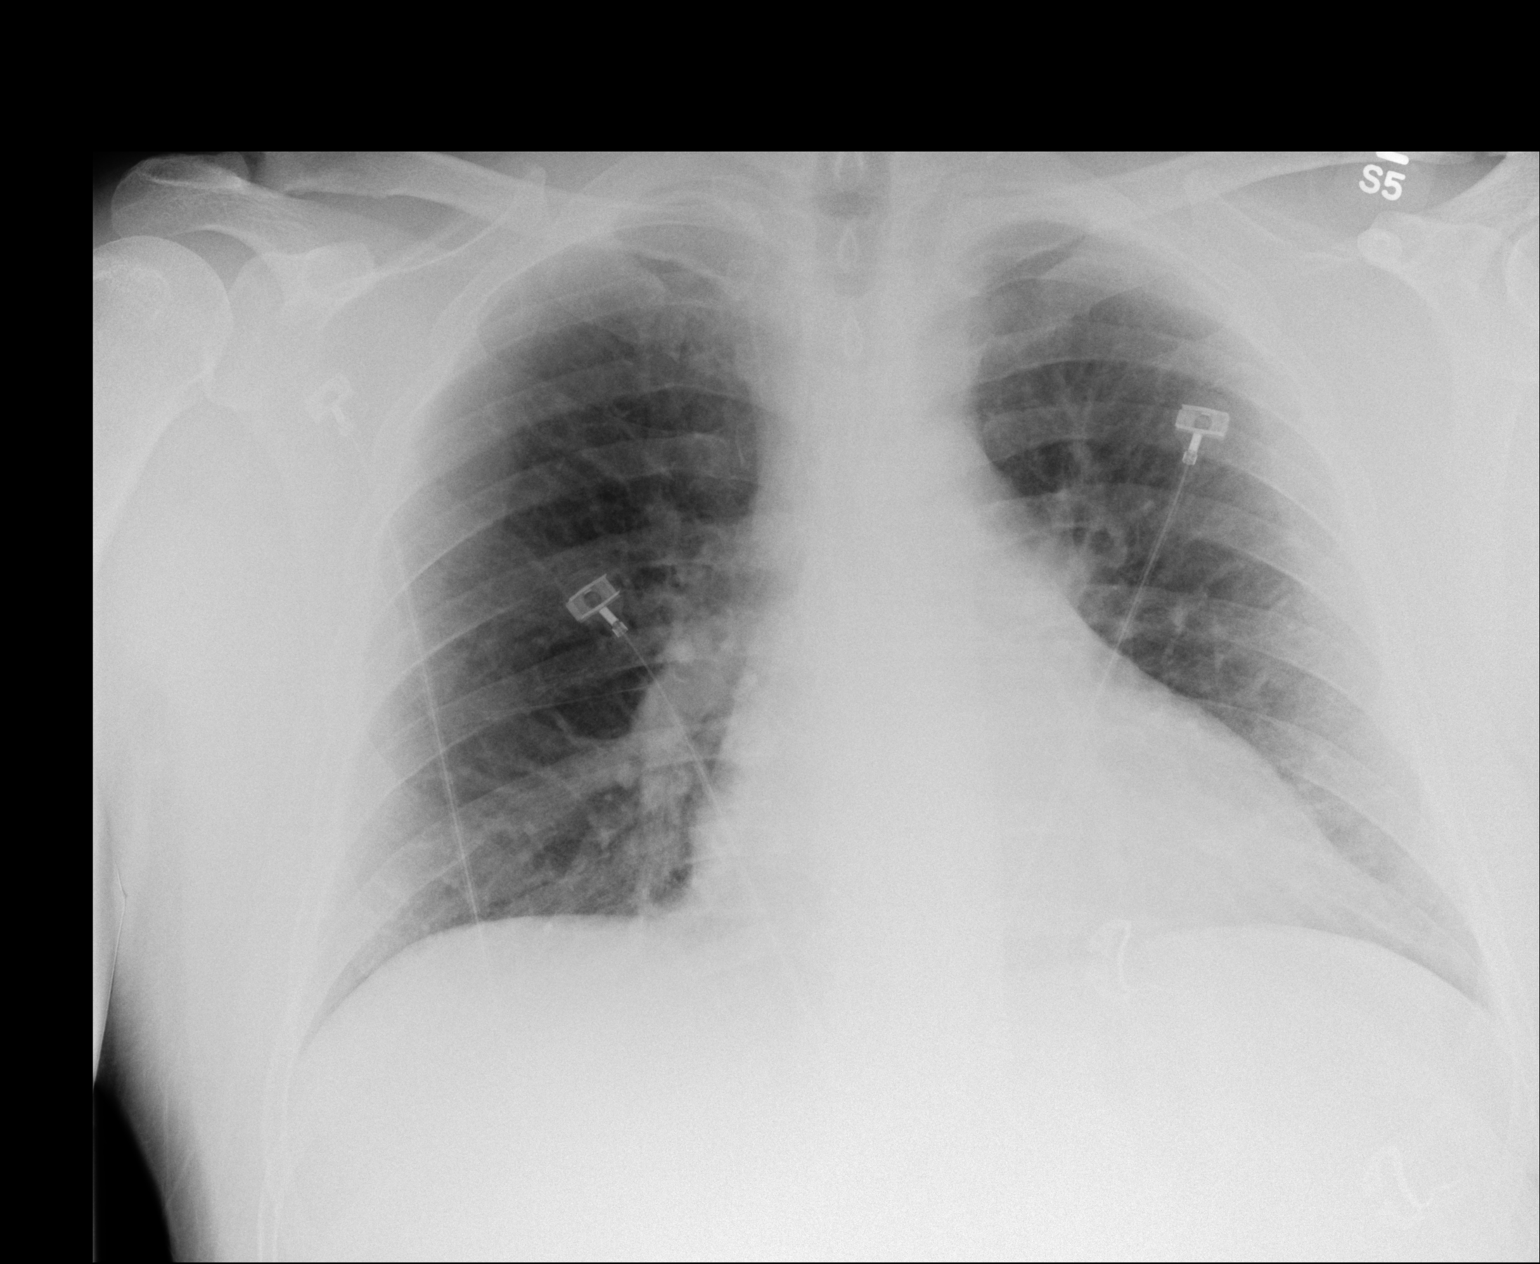

[1 of 1 positions shown; findings below may reference images not displayed]

FINDINGS: The heart size and mediastinal contours are within normal limits.
Both lungs are clear. No evidence of pneumothorax or pleural
effusion.
IMPRESSION: No active disease.

## 2017-05-16 ENCOUNTER — Other Ambulatory Visit: Payer: Self-pay | Admitting: Cardiology

## 2017-05-31 ENCOUNTER — Ambulatory Visit: Payer: BLUE CROSS/BLUE SHIELD | Admitting: Cardiology

## 2017-06-07 ENCOUNTER — Ambulatory Visit (INDEPENDENT_AMBULATORY_CARE_PROVIDER_SITE_OTHER): Payer: BLUE CROSS/BLUE SHIELD | Admitting: Cardiology

## 2017-06-07 ENCOUNTER — Encounter: Payer: Self-pay | Admitting: Cardiology

## 2017-06-07 VITALS — BP 129/72 | HR 60 | Ht 69.0 in | Wt 229.4 lb

## 2017-06-07 DIAGNOSIS — R21 Rash and other nonspecific skin eruption: Secondary | ICD-10-CM

## 2017-06-07 DIAGNOSIS — E782 Mixed hyperlipidemia: Secondary | ICD-10-CM

## 2017-06-07 DIAGNOSIS — I2510 Atherosclerotic heart disease of native coronary artery without angina pectoris: Secondary | ICD-10-CM

## 2017-06-07 MED ORDER — SIMVASTATIN 20 MG PO TABS: 20.0000 mg | ORAL_TABLET | Freq: Every day | ORAL | 1 refills | Status: DC

## 2017-06-07 MED ORDER — METOPROLOL TARTRATE 25 MG PO TABS: 25.0000 mg | ORAL_TABLET | Freq: Two times a day (BID) | ORAL | 1 refills | Status: DC

## 2017-06-07 NOTE — Progress Notes (Signed)
Clinical Summary Mr. Gieske is a 52 y.o.male seen today for follow up of the following medical problems.   1. CAD - previous NSTEMI 12/2008 where he received a DES to RCA - admit 10/2014 with chest pain, trop up to 0.08. LHC showed RCA disease again that received a DES. Normal LVEF by LVgram during that admit. 2010 echo LVEF 55% with moderate to severe inferior hypokinesis.     - no recent chest pain, no SOB/DOE. Walks regularly up to 2 miles - compliant with meds.   2. Hyperlipidemia - muscle aches on lipitor and crestor. Notes mild aches on low dose simva but tolerable. Atypical for statin induced myalgias as mainly involve his feet after standing or working long periods of time.  - currently on atorva 80mg  daily, tolerating. Still with foot pains, unclear if related.   - 05/2016 TC 146 TG 186 HDL 31 LDL 78 - compliant with statin. Back on simva 20mg  daily.    3. Skin rash - rash on skin since changing atorvastatin and lisinopril. Seems mainly to develop when out in the sunlight. - rash got better off lisinopril. We then started losartan. Since then rash has reoccurred.      SH: works in Designer, fashion/clothing, Special educational needs teacher.     Past Medical History:  Diagnosis Date  . CAD (coronary artery disease), native coronary artery   . Hyperlipidemia   . Myocardial infarction (HCC) 12/29/2008  . Pneumonia 02/2014   "not sure if it was pneumonia or flu"     Allergies  Allergen Reactions  . Oxycodone-Acetaminophen Shortness Of Breath and Other (See Comments)    Chest tightness and numbness   . Lipitor [Atorvastatin] Other (See Comments)    Muscle aches      Current Outpatient Medications  Medication Sig Dispense Refill  . amoxicillin (AMOXIL) 875 MG tablet Take 1 tablet (875 mg total) by mouth 2 (two) times daily. 20 tablet 0  . aspirin 81 MG chewable tablet Chew 1 tablet (81 mg total) by mouth daily.    . ciprofloxacin-dexamethasone (CIPRODEX) OTIC suspension Place 4 drops  into the left ear 2 (two) times daily. 7.5 mL 0  . losartan (COZAAR) 25 MG tablet TAKE 1/2 (ONE-HALF) TABLET BY MOUTH ONCE DAILY 45 tablet 0  . metoprolol tartrate (LOPRESSOR) 25 MG tablet TAKE 1 TABLET BY MOUTH TWICE DAILY 180 tablet 0  . NITROSTAT 0.4 MG SL tablet Place 1 tablet (0.4 mg total) under the tongue every 5 (five) minutes as needed for chest pain. 25 tablet 4  . simvastatin (ZOCOR) 20 MG tablet Take 1 tablet (20 mg total) at bedtime by mouth. 90 tablet 3   No current facility-administered medications for this visit.      Past Surgical History:  Procedure Laterality Date  . APPENDECTOMY  ~ 2008  . CARDIAC CATHETERIZATION  2011  . CARDIAC CATHETERIZATION N/A 10/04/2014   Procedure: Left Heart Cath and Coronary Angiography;  Surgeon: Tonny Bollman, MD;  Location[facility] INVASIVE CV LAB;  Service: Cardiovascular;  Laterality: N/A;  . CARDIAC CATHETERIZATION N/A 10/04/2014   Procedure: Coronary Stent Intervention;  Surgeon: Tonny Bollman, MD;  Location: [facility] INVASIVE CV LAB;  Service: Cardiovascular;  Laterality: N/A;  . CORONARY ANGIOPLASTY WITH STENT PLACEMENT  2010; 10/04/2014  . FINGER FRACTURE SURGERY Left ~ 2010   "middle finger"  . FOREARM FRACTURE SURGERY Left 1997  . FRACTURE SURGERY    . WRIST FRACTURE SURGERY Left 1997   w/thumb fracture  Allergies  Allergen Reactions  . Oxycodone-Acetaminophen Shortness Of Breath and Other (See Comments)    Chest tightness and numbness   . Lipitor [Atorvastatin] Other (See Comments)    Muscle aches       Family History  Problem Relation Age of Onset  . Heart failure Father   . Cancer Other   . Cancer Mother        pancreatic  . Colon cancer Neg Hx      Social History Mr. Katrinka BlazingSmith reports that he quit smoking about 8 years ago. His smoking use included cigarettes. He has a 45.00 pack-year smoking history. He has never used smokeless tobacco. Mr. Katrinka BlazingSmith reports that he does not drink alcohol.   Review of  Systems CONSTITUTIONAL: No weight loss, fever, chills, weakness or fatigue.  HEENT: Eyes: No visual loss, blurred vision, double vision or yellow sclerae.No hearing loss, sneezing, congestion, runny nose or sore throat.  SKIN: No rash or itching.  CARDIOVASCULAR: per hpi RESPIRATORY: No shortness of breath, cough or sputum.  GASTROINTESTINAL: No anorexia, nausea, vomiting or diarrhea. No abdominal pain or blood.  GENITOURINARY: No burning on urination, no polyuria NEUROLOGICAL: No headache, dizziness, syncope, paralysis, ataxia, numbness or tingling in the extremities. No change in bowel or bladder control.  MUSCULOSKELETAL: No muscle, back pain, joint pain or stiffness.  LYMPHATICS: No enlarged nodes. No history of splenectomy.  PSYCHIATRIC: No history of depression or anxiety.  ENDOCRINOLOGIC: No reports of sweating, cold or heat intolerance. No polyuria or polydipsia.  Marland Kitchen.   Physical Examination Vitals:   06/07/17 0943  BP: 129/72  Pulse: 60  SpO2: 96%   Vitals:   06/07/17 0943  Weight: 229 lb 6.4 oz (104.1 kg)  Height: 5\' 9"  (1.753 m)    Gen: resting comfortably, no acute distress HEENT: no scleral icterus, pupils equal round and reactive, no palptable cervical adenopathy,  CV: RRR, no m/r/g, no jvd Resp: Clear to auscultation bilaterally GI: abdomen is soft, non-tender, non-distended, normal bowel sounds, no hepatosplenomegaly MSK: extremities are warm, no edema.  Skin: warm, no rash Neuro:  no focal deficits Psych: appropriate affect   Diagnostic Studies 09/2014 Cath  Mid RCA lesion, 90% stenosed.  Mid LAD lesion, 40% stenosed.  The left ventricular systolic function is normal.  Prox RCA lesion, 60% stenosed. There is a 0% residual stenosis post intervention. The lesion was previously treated with a stent (unknown type) .  A drug-eluting stent was placed.  1. Severe single vessel CAD involving the RCA treated successfully with PTCA and stenting (DES) 2. Mild  nonobstructive disease of the LAD 3. Widely patent LCx 4. Preserved overall LV function  Continue DAPT with ASA and Effient x 12 months as tolerated     Assessment and Plan   1. CAD - s/p NSTEMI 10/2014 with DES to RCA placed - no symptoms, continue current meds.  - EKG today shows SR, no ischemic changes  2. Hyperlipidemia -nonspecific aches on statins, back on simva 20mg  and tolerating. Will continue.  - LDL reasonable given treatment limitiations, counsled on dietary and lifestyle modifications to improve TGs and HDL  3. Skin rash - apparent photosensitivity on both lisinopril previously and losartan currently. He will hold losartan x 1 month and update us if rash improves.    F/u 1 year      Antoine PocheJonathan F. Shatoria Stooksbury, M.D.

## 2017-06-07 NOTE — Patient Instructions (Signed)
Your physician wants you to follow-up in: [facility] You will receive a reminder letter in the mail two months in advance. If you don't receive a letter, please call our office to schedule the follow-up appointment.  Your physician has recommended you make the following change in your medication:   HOLD LOSARTAN 1 MONTH AND CALL US WITH AN UPDATE ON YOUR RASH  Thank you for choosing [facility]!!

## 2017-06-30 ENCOUNTER — Encounter: Payer: Self-pay | Admitting: Family Medicine

## 2017-06-30 ENCOUNTER — Ambulatory Visit (INDEPENDENT_AMBULATORY_CARE_PROVIDER_SITE_OTHER): Payer: BLUE CROSS/BLUE SHIELD | Admitting: Family Medicine

## 2017-06-30 VITALS — BP 117/71 | HR 58 | Temp 97.3°F | Ht 69.0 in | Wt 229.0 lb

## 2017-06-30 DIAGNOSIS — R7303 Prediabetes: Secondary | ICD-10-CM

## 2017-06-30 DIAGNOSIS — I10 Essential (primary) hypertension: Secondary | ICD-10-CM | POA: Diagnosis not present

## 2017-06-30 DIAGNOSIS — E7800 Pure hypercholesterolemia, unspecified: Secondary | ICD-10-CM | POA: Diagnosis not present

## 2017-06-30 DIAGNOSIS — Z23 Encounter for immunization: Secondary | ICD-10-CM | POA: Diagnosis not present

## 2017-06-30 NOTE — Progress Notes (Signed)
BP 117/71   Pulse (!) 58   Temp (!) 97.3 F (36.3 C) (Oral)   Ht 5' 9"  (1.753 m)   Wt 229 lb (103.9 kg)   BMI 33.82 kg/m    Subjective:    Patient ID: Dennis Terry, male    DOB: 1965-05-17, 52 y.o.   MRN: [id number]  HPI: Dennis Terry is a 52 y.o. male presenting on 06/30/2017 for Hypertension (patient has eaten today; cardiologist recently discontinued Losartan because of rash) and Prediabetes   HPI Hyperlipidemia Patient is coming in for recheck of his hyperlipidemia. The patient is currently taking simvastatin. They deny any issues with myalgias or history of liver damage from it. They deny any focal numbness or weakness or chest pain.   Prediabetes Patient comes in today for recheck of his diabetes. Patient has been currently taking no medication but his diet control we are monitoring for now, stopped losartan because of rash. Patient is not currently on an ACE inhibitor/ARB. Patient has not seen an ophthalmologist this year. Patient denies any issues with their feet.   Hypertension Patient is currently on losartan and metoprolol but stopped the losartan because of a rash, and their blood pressure today is 117/71. Patient denies any lightheadedness or dizziness. Patient denies headaches, blurred vision, chest pains, shortness of breath, or weakness. Denies any side effects from medication and is content with current medication.   Relevant past medical, surgical, family and social history reviewed and updated as indicated. Interim medical history since our last visit reviewed. Allergies and medications reviewed and updated.  Review of Systems  Constitutional: Negative for chills and fever.  Respiratory: Negative for shortness of breath and wheezing.   Cardiovascular: Negative for chest pain and leg swelling.  Musculoskeletal: Negative for back pain and gait problem.  Skin: Negative for rash.  Neurological: Negative for weakness, light-headedness and headaches.    Psychiatric/Behavioral: Negative for dysphoric mood, self-injury, sleep disturbance and suicidal ideas. The patient is not nervous/anxious.   All other systems reviewed and are negative.   Per HPI unless specifically indicated above   Allergies as of 06/30/2017      Reactions   Oxycodone-acetaminophen Shortness Of Breath, Other (See Comments)   Chest tightness and numbness    Lipitor [atorvastatin] Other (See Comments)   Muscle aches       Medication List        Accurate as of 06/30/17 11:04 AM. Always use your most recent med list.          aspirin 81 MG chewable tablet Chew 1 tablet (81 mg total) by mouth daily.   losartan 25 MG tablet Commonly known as:  COZAAR TAKE 1/2 (ONE-HALF) TABLET BY MOUTH ONCE DAILY   metoprolol tartrate 25 MG tablet Commonly known as:  LOPRESSOR Take 1 tablet (25 mg total) by mouth 2 (two) times daily.   NITROSTAT 0.4 MG SL tablet Generic drug:  nitroGLYCERIN Place 1 tablet (0.4 mg total) under the tongue every 5 (five) minutes as needed for chest pain.   simvastatin 20 MG tablet Commonly known as:  ZOCOR Take 1 tablet (20 mg total) by mouth at bedtime.          Objective:    BP 117/71   Pulse (!) 58   Temp (!) 97.3 F (36.3 C) (Oral)   Ht 5' 9"  (1.753 m)   Wt 229 lb (103.9 kg)   BMI 33.82 kg/m   Wt Readings from Last 3 Encounters:  06/30/17 229 lb (103.9  kg)  06/07/17 229 lb 6.4 oz (104.1 kg)  09/10/16 226 lb (102.5 kg)    Physical Exam  Constitutional: He is oriented to person, place, and time. He appears well-developed and well-nourished. No distress.  Eyes: Conjunctivae are normal. No scleral icterus.  Neck: Neck supple. No thyromegaly present.  Cardiovascular: Normal rate, regular rhythm, normal heart sounds and intact distal pulses.  No murmur heard. Pulmonary/Chest: Effort normal and breath sounds normal. No respiratory distress. He has no wheezes.  Musculoskeletal: Normal range of motion. He exhibits no edema.   Lymphadenopathy:    He has no cervical adenopathy.  Neurological: He is alert and oriented to person, place, and time. Coordination normal.  Skin: Skin is warm and dry. No rash noted. He is not diaphoretic.  Psychiatric: He has a normal mood and affect. His behavior is normal.  Nursing note and vitals reviewed.      Assessment & Plan:   Problem List Items Addressed This Visit      Cardiovascular and Mediastinum   Hypertension   Relevant Orders   CMP14+EGFR   CBC with Differential/Platelet     Other   Hyperlipidemia - Primary   Relevant Orders   CBC with Differential/Platelet   Lipid panel   Prediabetes   Relevant Orders   Bayer DCA Hb A1c Waived   CMP14+EGFR   CBC with Differential/Platelet     Follow up plan: Return in about 1 year (around 07/01/2018), or if symptoms worsen or fail to improve, for Patient will return in 1 year for recheck.  Counseling provided for all of the vaccine components Orders Placed This Encounter  Procedures  . Bayer DCA Hb A1c Waived  . CMP14+EGFR  . CBC with Differential/Platelet  . Lipid panel    Caryl Pina, MD [facility] 06/30/2017, 11:04 AM

## 2017-07-14 ENCOUNTER — Other Ambulatory Visit: Payer: BLUE CROSS/BLUE SHIELD

## 2017-07-14 DIAGNOSIS — I10 Essential (primary) hypertension: Secondary | ICD-10-CM

## 2017-07-14 DIAGNOSIS — R7303 Prediabetes: Secondary | ICD-10-CM

## 2017-07-14 DIAGNOSIS — E7800 Pure hypercholesterolemia, unspecified: Secondary | ICD-10-CM

## 2017-07-14 LAB — CMP14+EGFR
ALT: 35 IU/L (ref 0–44)
AST: 29 IU/L (ref 0–40)
Albumin/Globulin Ratio: 1.6 (ref 1.2–2.2)
Albumin: 4.2 g/dL (ref 3.5–5.5)
Alkaline Phosphatase: 83 IU/L (ref 39–117)
BUN/Creatinine Ratio: 18 (ref 9–20)
BUN: 16 mg/dL (ref 6–24)
Bilirubin Total: 0.5 mg/dL (ref 0.0–1.2)
CO2: 22 mmol/L (ref 20–29)
Calcium: 8.9 mg/dL (ref 8.7–10.2)
Chloride: 103 mmol/L (ref 96–106)
Creatinine, Ser: 0.87 mg/dL (ref 0.76–1.27)
GFR calc Af Amer: 114 mL/min/{1.73_m2} (ref >59)
GFR calc non Af Amer: 99 mL/min/{1.73_m2} (ref >59)
Globulin, Total: 2.7 g/dL (ref 1.5–4.5)
Glucose: 132 mg/dL — ABNORMAL HIGH (ref 65–99)
Potassium: 4.4 mmol/L (ref 3.5–5.2)
Sodium: 140 mmol/L (ref 134–144)
Total Protein: 6.9 g/dL (ref 6.0–8.5)

## 2017-07-14 LAB — CBC WITH DIFFERENTIAL/PLATELET
Basophils Absolute: 0 10*3/uL (ref 0.0–0.2)
Basos: 1 %
EOS (ABSOLUTE): 0.2 10*3/uL (ref 0.0–0.4)
Eos: 4 %
Hematocrit: 40.8 % (ref 37.5–51.0)
Hemoglobin: 14.5 g/dL (ref 13.0–17.7)
Immature Grans (Abs): 0 10*3/uL (ref 0.0–0.1)
Immature Granulocytes: 0 %
Lymphocytes Absolute: 2 10*3/uL (ref 0.7–3.1)
Lymphs: 44 %
MCH: 30.5 pg (ref 26.6–33.0)
MCHC: 35.5 g/dL (ref 31.5–35.7)
MCV: 86 fL (ref 79–97)
Monocytes Absolute: 0.3 10*3/uL (ref 0.1–0.9)
Monocytes: 7 %
Neutrophils Absolute: 2.1 10*3/uL (ref 1.4–7.0)
Neutrophils: 44 %
Platelets: 168 10*3/uL (ref 150–450)
RBC: 4.76 x10E6/uL (ref 4.14–5.80)
RDW: 14.5 % (ref 12.3–15.4)
WBC: 4.6 10*3/uL (ref 3.4–10.8)

## 2017-07-14 LAB — LIPID PANEL
Chol/HDL Ratio: 4.8 ratio (ref 0.0–5.0)
Cholesterol, Total: 140 mg/dL (ref 100–199)
HDL: 29 mg/dL — ABNORMAL LOW (ref >39)
LDL Calculated: 84 mg/dL (ref 0–99)
Triglycerides: 137 mg/dL (ref 0–149)
VLDL Cholesterol Cal: 27 mg/dL (ref 5–40)

## 2017-07-14 LAB — BAYER DCA HB A1C WAIVED: HB A1C (BAYER DCA - WAIVED): 6.5 % (ref <7.0)

## 2017-08-04 ENCOUNTER — Telehealth: Payer: Self-pay | Admitting: Cardiology

## 2017-08-04 NOTE — Telephone Encounter (Signed)
Patient called to let Dr Wyline MoodBranch know that he is doing better since medication change

## 2017-08-04 NOTE — Telephone Encounter (Signed)
Will make Dr. Wyline MoodBranch aware.

## 2017-12-13 ENCOUNTER — Ambulatory Visit (INDEPENDENT_AMBULATORY_CARE_PROVIDER_SITE_OTHER): Payer: BLUE CROSS/BLUE SHIELD | Admitting: Physician Assistant

## 2017-12-13 ENCOUNTER — Encounter: Payer: Self-pay | Admitting: Physician Assistant

## 2017-12-13 VITALS — BP 133/84 | HR 61 | Temp 97.0°F | Ht 69.0 in | Wt 227.8 lb

## 2017-12-13 DIAGNOSIS — J0110 Acute frontal sinusitis, unspecified: Secondary | ICD-10-CM | POA: Diagnosis not present

## 2017-12-13 DIAGNOSIS — R52 Pain, unspecified: Secondary | ICD-10-CM | POA: Diagnosis not present

## 2017-12-13 LAB — VERITOR FLU A/B WAIVED
Influenza A: NEGATIVE
Influenza B: NEGATIVE

## 2017-12-13 MED ORDER — AMOXICILLIN-POT CLAVULANATE 875-125 MG PO TABS: 1.0000 | ORAL_TABLET | Freq: Two times a day (BID) | ORAL | 0 refills | Status: DC

## 2017-12-13 NOTE — Progress Notes (Signed)
BP 133/84   Pulse 61   Temp (!) 97 F (36.1 C) (Oral)   Ht _0  (1.753 m)   Wt 227 lb 12.8 oz (103.3 kg)   BMI 33.64 kg/m    Subjective:    Patient ID: Dennis Terry, male    DOB: Mar 04, 1965, 52 y.o.   MRN: [id number]  HPI: Dennis Terry is a 52 y.o. male presenting on 12/13/2017 for Headache; Generalized Body Aches; and Fever  This patient has had many days of sinus headache and postnasal drainage. There is copious drainage at times. Denies any fever at this time. There has been a history of sinus infections in the past.  No history of sinus surgery. There is cough at night. It has become more prevalent in recent days.  Past Medical History:  Diagnosis Date  . CAD (coronary artery disease), native coronary artery   . Hyperlipidemia   . Myocardial infarction ([facility]) 12/29/2008  . Pneumonia 02/2014   "not sure if it was pneumonia or flu"   Relevant past medical, surgical, family and social history reviewed and updated as indicated. Interim medical history since our last visit reviewed. Allergies and medications reviewed and updated. DATA REVIEWED: CHART IN EPIC  Family History reviewed for pertinent findings.  Review of Systems  Constitutional: Positive for fatigue and fever. Negative for appetite change.  HENT: Positive for congestion, sinus pressure, sinus pain and sore throat.   Eyes: Negative.  Negative for pain and visual disturbance.  Respiratory: Negative for cough, chest tightness, shortness of breath and wheezing.   Cardiovascular: Negative.  Negative for chest pain, palpitations and leg swelling.  Gastrointestinal: Negative.  Negative for abdominal pain, diarrhea, nausea and vomiting.  Endocrine: Negative.   Genitourinary: Negative.   Musculoskeletal: Negative for back pain and myalgias.  Skin: Negative.  Negative for color change and rash.  Neurological: Negative for weakness, numbness and headaches.  Psychiatric/Behavioral: Negative.     Allergies as of  12/13/2017      Reactions   Oxycodone-acetaminophen Shortness Of Breath, Other (See Comments)   Chest tightness and numbness    Lipitor [atorvastatin] Other (See Comments)   Muscle aches       Medication List        Accurate as of 12/13/17  8:55 AM. Always use your most recent med list.          amoxicillin-clavulanate 875-125 MG tablet Commonly known as:  AUGMENTIN Take 1 tablet by mouth 2 (two) times daily.   aspirin 81 MG chewable tablet Chew 1 tablet (81 mg total) by mouth daily.   metoprolol tartrate 25 MG tablet Commonly known as:  LOPRESSOR Take 1 tablet (25 mg total) by mouth 2 (two) times daily.   NITROSTAT 0.4 MG SL tablet Generic drug:  nitroGLYCERIN Place 1 tablet (0.4 mg total) under the tongue every 5 (five) minutes as needed for chest pain.   simvastatin 20 MG tablet Commonly known as:  ZOCOR Take 1 tablet (20 mg total) by mouth at bedtime.          Objective:    BP 133/84   Pulse 61   Temp (!) 97 F (36.1 C) (Oral)   Ht _1  (1.753 m)   Wt 227 lb 12.8 oz (103.3 kg)   BMI 33.64 kg/m   Allergies  Allergen Reactions  . Oxycodone-Acetaminophen Shortness Of Breath and Other (See Comments)    Chest tightness and numbness   . Lipitor [Atorvastatin] Other (See Comments)    Muscle  aches     Wt Readings from Last 3 Encounters:  12/13/17 227 lb 12.8 oz (103.3 kg)  06/30/17 229 lb (103.9 kg)  06/07/17 229 lb 6.4 oz (104.1 kg)    Physical Exam  Constitutional: He is oriented to person, place, and time. He appears well-developed and well-nourished.  HENT:  Head: Normocephalic and atraumatic.  Right Ear: Tympanic membrane and external ear normal. No middle ear effusion.  Left Ear: Tympanic membrane and external ear normal.  No middle ear effusion.  Nose: Mucosal edema and rhinorrhea present. Right sinus exhibits no maxillary sinus tenderness. Left sinus exhibits no maxillary sinus tenderness.  Mouth/Throat: Uvula is midline. Posterior  oropharyngeal erythema present.  Eyes: Pupils are equal, round, and reactive to light. Conjunctivae and EOM are normal. Right eye exhibits no discharge. Left eye exhibits no discharge.  Neck: Normal range of motion.  Cardiovascular: Normal rate, regular rhythm and normal heart sounds.  Pulmonary/Chest: Effort normal and breath sounds normal. No respiratory distress. He has no wheezes.  Abdominal: Soft.  Lymphadenopathy:    He has no cervical adenopathy.  Neurological: He is alert and oriented to person, place, and time.  Skin: Skin is warm and dry.  Psychiatric: He has a normal mood and affect.    Results for orders placed or performed in visit on 07/14/17  Lipid panel  Result Value Ref Range   Cholesterol, Total 140 100 - 199 mg/dL   Triglycerides 137 0 - 149 mg/dL   HDL 29 (L) >39 mg/dL   VLDL Cholesterol Cal 27 5 - 40 mg/dL   LDL Calculated 84 0 - 99 mg/dL   Chol/HDL Ratio 4.8 0.0 - 5.0 ratio  CBC with Differential/Platelet  Result Value Ref Range   WBC 4.6 3.4 - 10.8 x10E3/uL   RBC 4.76 4.14 - 5.80 x10E6/uL   Hemoglobin 14.5 13.0 - 17.7 g/dL   Hematocrit 40.8 37.5 - 51.0 %   MCV 86 79 - 97 fL   MCH 30.5 26.6 - 33.0 pg   MCHC 35.5 31.5 - 35.7 g/dL   RDW 14.5 12.3 - 15.4 %   Platelets 168 150 - 450 x10E3/uL   Neutrophils 44 Not Estab. %   Lymphs 44 Not Estab. %   Monocytes 7 Not Estab. %   Eos 4 Not Estab. %   Basos 1 Not Estab. %   Neutrophils Absolute 2.1 1.4 - 7.0 x10E3/uL   Lymphocytes Absolute 2.0 0.7 - 3.1 x10E3/uL   Monocytes Absolute 0.3 0.1 - 0.9 x10E3/uL   EOS (ABSOLUTE) 0.2 0.0 - 0.4 x10E3/uL   Basophils Absolute 0.0 0.0 - 0.2 x10E3/uL   Immature Granulocytes 0 Not Estab. %   Immature Grans (Abs) 0.0 0.0 - 0.1 x10E3/uL  CMP14+EGFR  Result Value Ref Range   Glucose 132 (H) 65 - 99 mg/dL   BUN 16 6 - 24 mg/dL   Creatinine, Ser 0.87 0.76 - 1.27 mg/dL   GFR calc non Af Amer 99 >59 mL/min/1.73   GFR calc Af Amer 114 >59 mL/min/1.73   BUN/Creatinine Ratio 18  9 - 20   Sodium 140 134 - 144 mmol/L   Potassium 4.4 3.5 - 5.2 mmol/L   Chloride 103 96 - 106 mmol/L   CO2 22 20 - 29 mmol/L   Calcium 8.9 8.7 - 10.2 mg/dL   Total Protein 6.9 6.0 - 8.5 g/dL   Albumin 4.2 3.5 - 5.5 g/dL   Globulin, Total 2.7 1.5 - 4.5 g/dL   Albumin/Globulin Ratio 1.6 1.2 -  2.2   Bilirubin Total 0.5 0.0 - 1.2 mg/dL   Alkaline Phosphatase 83 39 - 117 IU/L   AST 29 0 - 40 IU/L   ALT 35 0 - 44 IU/L  Bayer DCA Hb A1c Waived  Result Value Ref Range   HB A1C (BAYER DCA - WAIVED) 6.5 <7.0 %      Assessment & Plan:   1. Generalized body aches - Veritor Flu A/B Waived  2. Acute non-recurrent frontal sinusitis - amoxicillin-clavulanate (AUGMENTIN) 875-125 MG tablet; Take 1 tablet by mouth 2 (two) times daily.  Dispense: 20 tablet; Refill: 0   Continue all other maintenance medications as listed above.  Follow up plan: No follow-ups on file.  Educational handout given for [place] PA-C [place] [address]  [place], [place] [address] [phone]   12/13/2017, 8:55 AM

## 2018-02-20 ENCOUNTER — Other Ambulatory Visit: Payer: Self-pay | Admitting: Cardiology

## 2018-05-22 ENCOUNTER — Other Ambulatory Visit: Payer: Self-pay | Admitting: Cardiology

## 2018-07-20 ENCOUNTER — Other Ambulatory Visit: Payer: Self-pay

## 2018-07-20 ENCOUNTER — Ambulatory Visit (INDEPENDENT_AMBULATORY_CARE_PROVIDER_SITE_OTHER): Payer: BC Managed Care – PPO | Admitting: Family Medicine

## 2018-07-20 NOTE — Progress Notes (Signed)
Attempted to call the patient 3 different times, left a voicemail  Caryl Pina, MD [place] 07/20/2018, 11:47 AM

## 2018-09-07 ENCOUNTER — Ambulatory Visit: Payer: BC Managed Care – PPO | Admitting: Family Medicine

## 2018-09-14 DIAGNOSIS — C44729 Squamous cell carcinoma of skin of left lower limb, including hip: Secondary | ICD-10-CM | POA: Diagnosis not present

## 2018-09-14 DIAGNOSIS — L820 Inflamed seborrheic keratosis: Secondary | ICD-10-CM | POA: Diagnosis not present

## 2018-09-14 DIAGNOSIS — S90861A Insect bite (nonvenomous), right foot, initial encounter: Secondary | ICD-10-CM | POA: Diagnosis not present

## 2018-09-14 DIAGNOSIS — B353 Tinea pedis: Secondary | ICD-10-CM | POA: Diagnosis not present

## 2018-09-22 ENCOUNTER — Telehealth: Payer: Self-pay | Admitting: Cardiology

## 2018-09-22 NOTE — Telephone Encounter (Signed)
Virtual Visit Pre-Appointment Phone Call  "(Name), I am calling you today to discuss your upcoming appointment. We are currently trying to limit exposure to the virus that causes COVID-19 by seeing patients at home rather than in the office."  1. "What is the BEST phone number to call the day of the visit?" -   [phone] 2.   3. Do you have or have access to (through a family member/friend) a smartphone with video capability that we can use for your visit?" a. If yes - list this number in appt notes as cell (if different from BEST phone #) and list the appointment type as a VIDEO visit in appointment notes b. If no - list the appointment type as a PHONE visit in appointment notes  4. Confirm consent - "In the setting of the current Covid19 crisis, you are scheduled for a (phone or video) visit with your provider on (date) at (time).  Just as we do with many in-office visits, in order for you to participate in this visit, we must obtain consent.  If you'd like, I can send this to your mychart (if signed up) or email for you to review.  Otherwise, I can obtain your verbal consent now.  All virtual visits are billed to your insurance company just like a normal visit would be.  By agreeing to a virtual visit, we'd like you to understand that the technology does not allow for your provider to perform an examination, and thus may limit your provider's ability to fully assess your condition. If your provider identifies any concerns that need to be evaluated in person, we will make arrangements to do so.  Finally, though the technology is pretty good, we cannot assure that it will always work on either your or our end, and in the setting of a video visit, we may have to convert it to a phone-only visit.  In either situation, we cannot ensure that we have a secure connection.  Are you willing to proceed?" STAFF: Did the patient verbally acknowledge consent to telehealth visit? Document YES/NO here: yes    5. Advise patient to be prepared - "Two hours prior to your appointment, go ahead and check your blood pressure, pulse, oxygen saturation, and your weight (if you have the equipment to check those) and write them all down. When your visit starts, your provider will ask you for this information. If you have an Apple Watch or Kardia device, please plan to have heart rate information ready on the day of your appointment. Please have a pen and paper handy nearby the day of the visit as well."  6. Give patient instructions for MyChart download to smartphone OR Doximity/Doxy.me as below if video visit (depending on what platform provider is using)  7. Inform patient they will receive a phone call 15 minutes prior to their appointment time (may be from unknown caller ID) so they should be prepared to answer    TELEPHONE CALL NOTE  Dennis Terry has been deemed a candidate for a follow-up tele-health visit to limit community exposure during the Covid-19 pandemic. I spoke with the patient via phone to ensure availability of phone/video source, confirm preferred email & phone number, and discuss instructions and expectations.  I reminded Dennis Terry to be prepared with any vital sign and/or heart rhythm information that could potentially be obtained via home monitoring, at the time of his visit. I reminded Dennis Terry to expect a phone call prior to his visit.  Donata DuffVicky T Slaughter 09/22/2018 10:04 AM   INSTRUCTIONS FOR DOWNLOADING THE MYCHART APP TO SMARTPHONE  - The patient must first make sure to have activated MyChart and know their login information - If Apple, go to [employer] Store and type in MyChart in the search bar and download the app. If Android, ask patient to go to [employer] Play Store and type in [facility] in the search bar and download the app. The app is free but as with any other app downloads, their phone may require them to verify saved payment information or Apple/Android password.  - The  patient will need to then log into the app with their MyChart username and password, and select [facility] as their healthcare provider to link the account. When it is time for your visit, go to the MyChart app, find appointments, and click Begin Video Visit. Be sure to Select Allow for your device to access the Microphone and Camera for your visit. You will then be connected, and your provider will be with you shortly.  **If they have any issues connecting, or need assistance please contact MyChart service desk [phone])**  **If using a computer, in order to ensure the best quality for their visit they will need to use either of the following Internet Browsers: [employer] Edge, or Google Chrome**  IF USING DOXIMITY or DOXY.ME - The patient will receive a link just prior to their visit by text.     FULL LENGTH CONSENT FOR TELE-HEALTH VISIT   I hereby voluntarily request, consent and authorize [facility] and its employed or contracted physicians, physician assistants, nurse practitioners or other licensed health care professionals (the Practitioner), to provide me with telemedicine health care services (the Services") as deemed necessary by the treating Practitioner. I acknowledge and consent to receive the Services by the Practitioner via telemedicine. I understand that the telemedicine visit will involve communicating with the Practitioner through live audiovisual communication technology and the disclosure of certain medical information by electronic transmission. I acknowledge that I have been given the opportunity to request an in-person assessment or other available alternative prior to the telemedicine visit and am voluntarily participating in the telemedicine visit.  I understand that I have the right to withhold or withdraw my consent to the use of telemedicine in the course of my care at any time, without affecting my right to future care or treatment, and that the  Practitioner or I may terminate the telemedicine visit at any time. I understand that I have the right to inspect all information obtained and/or recorded in the course of the telemedicine visit and may receive copies of available information for a reasonable fee.  I understand that some of the potential risks of receiving the Services via telemedicine include:   Delay or interruption in medical evaluation due to technological equipment failure or disruption;  Information transmitted may not be sufficient (e.g. poor resolution of images) to allow for appropriate medical decision making by the Practitioner; and/or   In rare instances, security protocols could fail, causing a breach of personal health information.  Furthermore, I acknowledge that it is my responsibility to provide information about my medical history, conditions and care that is complete and accurate to the best of my ability. I acknowledge that Practitioner's advice, recommendations, and/or decision may be based on factors not within their control, such as incomplete or inaccurate data provided by me or distortions of diagnostic images or specimens that may result from electronic transmissions. I understand that the  practice of medicine is not an Chief Strategy Officer and that Practitioner makes no warranties or guarantees regarding treatment outcomes. I acknowledge that I will receive a copy of this consent concurrently upon execution via email to the email address I last provided but may also request a printed copy by calling the office of [facility].    I understand that my insurance will be billed for this visit.   I have read or had this consent read to me.  I understand the contents of this consent, which adequately explains the benefits and risks of the Services being provided via telemedicine.   I have been provided ample opportunity to ask questions regarding this consent and the Services and have had my questions answered to my  satisfaction.  I give my informed consent for the services to be provided through the use of telemedicine in my medical care  By participating in this telemedicine visit I agree to the above.

## 2018-09-25 ENCOUNTER — Encounter: Payer: Self-pay | Admitting: Cardiology

## 2018-09-25 ENCOUNTER — Telehealth (INDEPENDENT_AMBULATORY_CARE_PROVIDER_SITE_OTHER): Payer: BLUE CROSS/BLUE SHIELD | Admitting: Cardiology

## 2018-09-25 VITALS — BP 143/84 | HR 61 | Ht 69.0 in | Wt 224.0 lb

## 2018-09-25 DIAGNOSIS — E782 Mixed hyperlipidemia: Secondary | ICD-10-CM

## 2018-09-25 DIAGNOSIS — I2510 Atherosclerotic heart disease of native coronary artery without angina pectoris: Secondary | ICD-10-CM | POA: Diagnosis not present

## 2018-09-25 MED ORDER — SIMVASTATIN 20 MG PO TABS: 20.0000 mg | ORAL_TABLET | Freq: Every day | ORAL | 3 refills | Status: DC

## 2018-09-25 MED ORDER — METOPROLOL TARTRATE 25 MG PO TABS: 25.0000 mg | ORAL_TABLET | Freq: Two times a day (BID) | ORAL | 3 refills | Status: DC

## 2018-09-25 NOTE — Patient Instructions (Addendum)
Medication Instructions:   90 day supply with refills sent to pharmacy today on Simvastatin & Metoprolol.  Continue all other medications.    Labwork: none  Testing/Procedures: none  Follow-Up: Your physician wants you to follow up in:  1 year.  You will receive a reminder letter in the mail one-two months in advance.  If you don't receive a letter, please call our office to schedule the follow up appointment   Any Other Special Instructions Will Be Listed Below (If Applicable).  Please recheck your blood pressure at home to make sure it is not staying elevated.  Per Dr. Harl Bowie - may need to add a new medicine if stays elevated, but want to make sure medication is needed first.    Can keep log for the next 2 weeks to see what numbers are looking like, then call the office to report readings.     If you need a refill on your cardiac medications before your next appointment, please call your pharmacy.

## 2018-09-25 NOTE — Progress Notes (Signed)
Virtual Visit via Telephone Note   This visit type was conducted due to national recommendations for restrictions regarding the COVID-19 Pandemic (e.g. social distancing) in an effort to limit this patient's exposure and mitigate transmission in our community.  Due to his co-morbid illnesses, this patient is at least at moderate risk for complications without adequate follow up.  This format is felt to be most appropriate for this patient at this time.  The patient did not have access to video technology/had technical difficulties with video requiring transitioning to audio format only (telephone).  All issues noted in this document were discussed and addressed.  No physical exam could be performed with this format.  Please refer to the patient's chart for his  consent [facility].   Date:  09/25/2018   ID:  Dennis Terry, DOB 12/03/1965, MRN [id number]  Patient Location: Home Provider Location: Office  PCP:  Dettinger, Elige RadonJoshua A, MD  Cardiologist:  Dina RichBranch, Suzi Hernan, MD  Electrophysiologist:  None   Evaluation Performed:  Follow-Up Visit  Chief Complaint:  Follow up  History of Present Illness:    Dennis Terry is a 53 y.o. male seen today for follow up of the following medical problems.   1. CAD - previous NSTEMI 12/2008 where he received a DES to RCA - admit 10/2014 with chest pain, trop up to 0.08. LHC showed RCA disease again that received a DES. Normal LVEF by LVgram during that admit. 2010 echo LVEF 55% with moderate to severe inferior hypokinesis.    - no recent chest pain. - no sob/doe - compliant with meds. Photosensitivity on both ACEI and ARB, now oww Walks regularly without troubles   2. Hyperlipidemia - muscle aches on lipitor and crestor.Tolerates low dose simva - 05/2016 TC 146 TG 186 HDL 31 LDL 78 - compliant with statin. Back on simva 20mg  daily.   07/2017 TC 140 TG 137 HDL 29 LDL 84. Tolerates simva 20mg  daily without troubles.   3.  Skin rash - rash on skin since changing atorvastatin and lisinopril.Seems mainly to develop when out in the sunlight. - rash got better off lisinopril. We then started losartan. Since then rash has reoccurred.   - rash has improved since stopping losartan at last visit    SH: works in Designer, fashion/clothingtextiles, Special educational needs teachermaintence.Furloughed 3 months during COVID, now back to work  The patient does not have symptoms concerning for COVID-19 infection (fever, chills, cough, or new shortness of breath).    Past Medical History:  Diagnosis Date  . CAD (coronary artery disease), native coronary artery   . Hyperlipidemia   . Myocardial infarction ([facility]) 12/29/2008  . Pneumonia 02/2014   "not sure if it was pneumonia or flu"   Past Surgical History:  Procedure Laterality Date  . APPENDECTOMY  ~ 2008  . CARDIAC CATHETERIZATION  2011  . CARDIAC CATHETERIZATION N/A 10/04/2014   Procedure: Left Heart Cath and Coronary Angiography;  Surgeon: Tonny BollmanMichael Cooper, MD;  [facility];  Service: Cardiovascular;  Laterality: N/A;  . CARDIAC CATHETERIZATION N/A 10/04/2014   Procedure: Coronary Stent Intervention;  Surgeon: Tonny BollmanMichael Cooper, MD;  [facility];  Service: Cardiovascular;  Laterality: N/A;  . CORONARY ANGIOPLASTY WITH STENT PLACEMENT  2010; 10/04/2014  . FINGER FRACTURE SURGERY Left ~ 2010   "middle finger"  . FOREARM FRACTURE SURGERY Left 1997  . FRACTURE SURGERY    . WRIST FRACTURE SURGERY Left 1997   w/thumb fracture     No outpatient  medications have been marked as taking for the 09/25/18 encounter (Appointment) with Arnoldo Lenis, MD.     Allergies:   Oxycodone-acetaminophen and Lipitor [atorvastatin]   Social History   Tobacco Use  . Smoking status: Former Smoker    Packs/day: 1.50    Years: 30.00    Pack years: 45.00    Types: Cigarettes    Quit date: 12/29/2008    Years since quitting: 9.7  . Smokeless tobacco: Never Used  Substance Use Topics  . Alcohol  use: No    Alcohol/week: 0.0 standard drinks    Comment: 10/04/2014 "none in the 2000's"  . Drug use: No     Family Hx: The patient's family history includes Cancer in his mother and another family member; Heart failure in his father. There is no history of Colon cancer.  ROS:   Please see the history of present illness.     All other systems reviewed and are negative.   Prior CV studies:   The following studies were reviewed today:  09/2014 Cath  Mid RCA lesion, 90% stenosed.  Mid LAD lesion, 40% stenosed.  The left ventricular systolic function is normal.  Prox RCA lesion, 60% stenosed. There is a 0% residual stenosis post intervention. The lesion was previously treated with a stent (unknown type) .  A drug-eluting stent was placed.  1. Severe single vessel CAD involving the RCA treated successfully with PTCA and stenting (DES) 2. Mild nonobstructive disease of the LAD 3. Widely patent LCx 4. Preserved overall LV function  Continue DAPT with ASA and Effient x 12 months as tolerated  Labs/Other Tests and Data Reviewed:    EKG:  No ECG reviewed.  Recent Labs: No results found for requested labs within last 8760 hours.   Recent Lipid Panel Lab Results  Component Value Date/Time   CHOL 140 07/14/2017 08:23 AM   TRIG 137 07/14/2017 08:23 AM   HDL 29 (L) 07/14/2017 08:23 AM   CHOLHDL 4.8 07/14/2017 08:23 AM   CHOLHDL 6.1 01/09/2010 01:56 AM   LDLCALC 84 07/14/2017 08:23 AM   LDLDIRECT 66.7 07/29/2009 08:28 AM    Wt Readings from Last 3 Encounters:  12/13/17 227 lb 12.8 oz (103.3 kg)  06/30/17 229 lb (103.9 kg)  06/07/17 229 lb 6.4 oz (104.1 kg)     Objective:    Vital Signs:   Today's Vitals   09/25/18 0816  Weight: 224 lb (101.6 kg)  Height: 5\' 9"  (1.753 m)   Body mass index is 33.08 kg/m.   Normal affect. Normal speech pattern and tone. Comfortable, no apparent distress. No audible signs of SOB or wheezing.   ASSESSMENT & PLAN:    1. CAD -  s/p NSTEMI 10/2014 with DES to RCA placed - no recent symptoms. Continue current meds, photosensitivyt on ACE I and ARB now off  2. Hyperlipidemia -nonspecific aches on statins, back on simva 20mg  and tolerating. - continue statin, upcmoing labs with pcp  He is to call us later with his home bp  COVID-19 Education: The signs and symptoms of COVID-19 were discussed with the patient and how to seek care for testing (follow up with PCP or arrange E-visit).  The importance of social distancing was discussed today.  Time:   Today, I have spent 15 minutes with the patient with telehealth technology discussing the above problems.     Medication Adjustments/Labs and Tests Ordered: Current medicines are reviewed at length with the patient today.  Concerns regarding medicines are  outlined above.   Tests Ordered: No orders of the defined types were placed in this encounter.   Medication Changes: No orders of the defined types were placed in this encounter.   Follow Up:  In Person in 1 year(s)  Signed, Dina Rich, MD  09/25/2018 8:17 AM    [facility]

## 2018-09-25 NOTE — Addendum Note (Signed)
Addended by: Laurine Blazer on: 09/25/2018 08:51 AM   Modules accepted: Orders

## 2019-05-22 ENCOUNTER — Ambulatory Visit: Payer: BC Managed Care – PPO | Attending: Internal Medicine

## 2019-05-22 ENCOUNTER — Ambulatory Visit: Payer: Self-pay

## 2019-05-22 ENCOUNTER — Other Ambulatory Visit: Payer: Self-pay

## 2019-05-22 DIAGNOSIS — Z20822 Contact with and (suspected) exposure to covid-19: Secondary | ICD-10-CM

## 2019-05-23 LAB — NOVEL CORONAVIRUS, NAA: SARS-CoV-2, NAA: NOT DETECTED

## 2019-05-23 LAB — SARS-COV-2, NAA 2 DAY TAT

## 2019-11-26 ENCOUNTER — Other Ambulatory Visit: Payer: Self-pay | Admitting: Cardiology

## 2019-12-30 ENCOUNTER — Other Ambulatory Visit: Payer: Self-pay | Admitting: Cardiology

## 2020-01-01 ENCOUNTER — Ambulatory Visit (INDEPENDENT_AMBULATORY_CARE_PROVIDER_SITE_OTHER): Payer: BC Managed Care – PPO | Admitting: Family Medicine

## 2020-01-01 ENCOUNTER — Other Ambulatory Visit: Payer: Self-pay

## 2020-01-01 ENCOUNTER — Encounter: Payer: Self-pay | Admitting: Family Medicine

## 2020-01-01 ENCOUNTER — Encounter: Payer: Self-pay | Admitting: *Deleted

## 2020-01-01 VITALS — BP 118/64 | HR 62 | Ht 69.0 in | Wt 224.6 lb

## 2020-01-01 DIAGNOSIS — E782 Mixed hyperlipidemia: Secondary | ICD-10-CM | POA: Diagnosis not present

## 2020-01-01 DIAGNOSIS — I2510 Atherosclerotic heart disease of native coronary artery without angina pectoris: Secondary | ICD-10-CM

## 2020-01-01 DIAGNOSIS — R079 Chest pain, unspecified: Secondary | ICD-10-CM

## 2020-01-01 MED ORDER — NITROSTAT 0.4 MG SL SUBL: 0.4000 mg | SUBLINGUAL_TABLET | SUBLINGUAL | 4 refills | Status: DC | PRN

## 2020-01-01 MED ORDER — ISOSORBIDE MONONITRATE ER 30 MG PO TB24: 30.0000 mg | ORAL_TABLET | Freq: Every day | ORAL | 3 refills | Status: DC

## 2020-01-01 NOTE — Patient Instructions (Signed)
Medication Instructions:  Your physician has recommended you make the following change in your medication:   Start Imdur 30 mg Daily   *If you need a refill on your cardiac medications before your next appointment, please call your pharmacy*   Lab Work: Your physician recommends that you return for lab work in: Fasting   If you have labs (blood work) drawn today and your tests are completely normal, you will receive your results only by: Marland Kitchen MyChart Message (if you have MyChart) OR . A paper copy in the mail If you have any lab test that is abnormal or we need to change your treatment, we will call you to review the results.   Testing/Procedures: Your physician has requested that you have a lexiscan myoview. For further information please visit [email or web address] Please follow instruction sheet, as given.   Follow-Up: At [facility], you and your health needs are our priority.  As part of our continuing mission to provide you with exceptional heart care, we have created designated Provider Care Teams.  These Care Teams include your primary Cardiologist (physician) and Advanced Practice Providers (APPs -  Physician Assistants and Nurse Practitioners) who all work together to provide you with the care you need, when you need it.  We recommend signing up for the patient portal called "MyChart".  Sign up information is provided on this After Visit Summary.  MyChart is used to connect with patients for Virtual Visits (Telemedicine).  Patients are able to view lab/test results, encounter notes, upcoming appointments, etc.  Non-urgent messages can be sent to your provider as well.   To learn more about what you can do with MyChart, go to [email or web address].    Your next appointment:   4 week(s)  The format for your next appointment:   In Person  Provider:   Nena Polio, NP   Other Instructions Thank you for choosing [facility]!

## 2020-01-01 NOTE — Progress Notes (Signed)
Cardiology Office Note  Date: 01/01/2020   ID: Dennis Terry, DOB 06/17/1965, MRN [id number]  PCP:  Dettinger, Elige Radon, MD  Cardiologist:  Dina Rich, MD Electrophysiologist:  None   Chief Complaint: Cardiac follow-up 1 year   History of Present Illness: Dennis Terry is a 54 y.o. male with a history of CAD, HLD, history of MI, prediabetes, hyperlipidemia, HTN.  Status post NSTEMI 12/23/2008 with DES to RCA. Admission 10/24/2014 with chest pain. Cardiac catheterization showed RCA disease with DES. Echocardiogram  Last encounter 09/25/2018 via telemedicine with Dr. Wyline Mood. Had no recent chest pain, shortness of breath or DOE. He was compliant with his medications. Was compliant with low-dose simvastatin. Statin associated muscle symptoms on Lipitor and Crestor in the past. Had skin rash which  improved since stopping the losartan at prior visit.  Patient is here for 1 year follow-up.  Denies any recent acute illnesses or hospitalizations.  He does state approximately 1 week ago he began having chest pain, abdominal pain with radiation around to his back which was on and off for 3 days.  He described this as similar to symptoms when he had had previous MI and coronary intervention.  Denied any nausea, vomiting or diaphoresis associated with this episode.Marland Kitchen  He states after 3 days this  resolved and he has had no further issues.  Currently denies any anginal or exertional symptoms, palpitations or arrhythmias, orthostatic symptoms.  Denies any CVA or TIA-like symptoms.  Denies any dyspeptic symptoms, or blood in stool or urine.  Denies any black tarry stools.  Denies any constipation or diarrhea.  He did state his wife had similar experience a couple days after.  He states he did not try sublingual nitroglycerin because he had run out of the medication.   Past Medical History:  Diagnosis Date  . CAD (coronary artery disease), native coronary artery   . Hyperlipidemia   . Myocardial  infarction (HCC) 12/29/2008  . Pneumonia 02/2014   "not sure if it was pneumonia or flu"    Past Surgical History:  Procedure Laterality Date  . APPENDECTOMY  ~ 2008  . CARDIAC CATHETERIZATION  2011  . CARDIAC CATHETERIZATION N/A 10/04/2014   Procedure: Left Heart Cath and Coronary Angiography;  Surgeon: Tonny Bollman, MD;  Location: [facility] INVASIVE CV LAB;  Service: Cardiovascular;  Laterality: N/A;  . CARDIAC CATHETERIZATION N/A 10/04/2014   Procedure: Coronary Stent Intervention;  Surgeon: Tonny Bollman, MD;  Location: [facility] INVASIVE CV LAB;  Service: Cardiovascular;  Laterality: N/A;  . CORONARY ANGIOPLASTY WITH STENT PLACEMENT  2010; 10/04/2014  . FINGER FRACTURE SURGERY Left ~ 2010   "middle finger"  . FOREARM FRACTURE SURGERY Left 1997  . FRACTURE SURGERY    . WRIST FRACTURE SURGERY Left 1997   w/thumb fracture    Current Outpatient Medications  Medication Sig Dispense Refill  . aspirin 81 MG chewable tablet Chew 1 tablet (81 mg total) by mouth daily.    . isosorbide mononitrate (IMDUR) 30 MG 24 hr tablet Take 1 tablet (30 mg total) by mouth daily. 90 tablet 3  . metoprolol tartrate (LOPRESSOR) 25 MG tablet Take 1 tablet (25 mg total) by mouth 2 (two) times daily. 60 tablet 0  . simvastatin (ZOCOR) 20 MG tablet TAKE 1 TABLET BY MOUTH AT BEDTIME 30 tablet 0  . NITROSTAT 0.4 MG SL tablet Place 1 tablet (0.4 mg total) under the tongue every 5 (five) minutes as needed for chest pain. 25 tablet 4   No current facility-administered  medications for this visit.   Allergies:  Oxycodone-acetaminophen, Oxycodone-acetaminophen, and Lipitor [atorvastatin]   Social History: The patient  reports that he quit smoking about 11 years ago. His smoking use included cigarettes. He has a 45.00 pack-year smoking history. He has never used smokeless tobacco. He reports that he does not drink alcohol and does not use drugs.   Family History: The patient's family history includes Cancer in his mother and  another family member; Heart failure in his father.   ROS:  Please see the history of present illness. Otherwise, complete review of systems is positive for none.  All other systems are reviewed and negative.   Physical Exam: VS:  BP 118/64   Pulse 62   Ht 5\' 9"  (1.753 m)   Wt 224 lb 9.6 oz (101.9 kg)   SpO2 96%   BMI 33.17 kg/m , BMI Body mass index is 33.17 kg/m.  Wt Readings from Last 3 Encounters:  01/01/20 224 lb 9.6 oz (101.9 kg)  09/25/18 224 lb (101.6 kg)  12/13/17 227 lb 12.8 oz (103.3 kg)    General: Patient appears comfortable at rest. Neck: Supple, no elevated JVP or carotid bruits, no thyromegaly. Lungs: Clear to auscultation, nonlabored breathing at rest. Cardiac: Regular rate and rhythm, no S3 or significant systolic murmur, no pericardial rub. Extremities: No pitting edema, distal pulses 2+. Skin: Warm and dry. Musculoskeletal: No kyphosis. Neuropsychiatric: Alert and oriented x3, affect grossly appropriate.  ECG:  An ECG dated 01/01/2020 was personally reviewed today and demonstrated:  Normal sinus rhythm rate of 65 bpm  Recent Labwork: No results found for requested labs within last 8760 hours.     Component Value Date/Time   CHOL 140 07/14/2017 0823   TRIG 137 07/14/2017 0823   HDL 29 (L) 07/14/2017 0823   CHOLHDL 4.8 07/14/2017 0823   CHOLHDL 6.1 01/09/2010 0156   VLDL 39 01/09/2010 0156   LDLCALC 84 07/14/2017 0823   LDLDIRECT 66.7 07/29/2009 0828    Other Studies Reviewed Today: 09/2014 Cath  Mid RCA lesion, 90% stenosed.  Mid LAD lesion, 40% stenosed.  The left ventricular systolic function is normal.  Prox RCA lesion, 60% stenosed. There is a 0% residual stenosis post intervention. The lesion was previously treated with a stent (unknown type) .  A drug-eluting stent was placed.  1. Severe single vessel CAD involving the RCA treated successfully with PTCA and stenting (DES) 2. Mild nonobstructive disease of the LAD 3. Widely patent  LCx 4. Preserved overall LV function  Continue DAPT with ASA and Effient x 12 months as tolerated  Diagnostic Dominance: Right    Intervention       Assessment and Plan:  1. CAD in native artery   2. Mixed hyperlipidemia   3. Chest pain, unspecified type    1. CAD in native artery Previous MI with most recent cardiac catheterization in 2016 demonstrating mid RCA lesion 90%, mid LAD lesion 40%, proximal RCA lesion 60% with DES placed and 0% residual stenosis.  Continue aspirin 81 mg p.o. daily.  Continue sublingual nitroglycerin as needed.  Refill sublingual nitroglycerin.  Patient states he ran out and was unable to take any nitroglycerin during his recent episode of chest pain.  2. Mixed hyperlipidemia Continue simvastatin 20 mg p.o. daily.  Last lipid panel 07/14/2017: TC 140, TG 137, HDL 29, LDL 84.  Please get a follow-up lipid panel  3. Chest pain, unspecified type Had a recent episode of chest pain on and off for approximately  3 days with radiation from mid chest to mid epigastric area around to back and shoulders.  He states this felt very similar to previous symptoms when he had previous MI.  Start Imdur 30 mg daily.  Refill sublingual nitroglycerin with a new bottle.  Get a Lexiscan stress test.  Medication Adjustments/Labs and Tests Ordered: Current medicines are reviewed at length with the patient today.  Concerns regarding medicines are outlined above.   Disposition: Follow-up with Dr. Wyline Mood or APP 4 weeks  Signed, Rennis Harding, NP 01/01/2020 4:04 PM    [facility] [address] [place], [place], [place] [address] Phone: [phone]; Fax: [phone]

## 2020-01-09 ENCOUNTER — Telehealth: Payer: Self-pay | Admitting: Cardiology

## 2020-01-09 ENCOUNTER — Other Ambulatory Visit: Payer: BC Managed Care – PPO

## 2020-01-09 ENCOUNTER — Other Ambulatory Visit: Payer: Self-pay

## 2020-01-09 DIAGNOSIS — E782 Mixed hyperlipidemia: Secondary | ICD-10-CM | POA: Diagnosis not present

## 2020-01-09 NOTE — Telephone Encounter (Signed)
Order placed to [facility]

## 2020-01-09 NOTE — Telephone Encounter (Signed)
Tish at [facility] called requesting that pt's labs be changed to [facility]

## 2020-01-10 LAB — LIPID PANEL
Chol/HDL Ratio: 4.7 ratio (ref 0.0–5.0)
Cholesterol, Total: 155 mg/dL (ref 100–199)
HDL: 33 mg/dL — ABNORMAL LOW (ref >39)
LDL Chol Calc (NIH): 95 mg/dL (ref 0–99)
Triglycerides: 151 mg/dL — ABNORMAL HIGH (ref 0–149)
VLDL Cholesterol Cal: 27 mg/dL (ref 5–40)

## 2020-01-11 ENCOUNTER — Telehealth: Payer: Self-pay | Admitting: *Deleted

## 2020-01-11 NOTE — Telephone Encounter (Signed)
-----   Message from Netta Neat., NP sent at 01/10/2020  8:18 AM EST ----- Recent lipid panel shows his LDL is still 95.  Ask him if he would be willing to go up on his simvastatin to 40 mg daily to see if we can get that number below 70 due to his coronary artery disease.  If he is agreeable go ahead and send in the prescription.  Thank you

## 2020-01-23 MED ORDER — SIMVASTATIN 40 MG PO TABS: 40.0000 mg | ORAL_TABLET | Freq: Every day | ORAL | 3 refills | Status: DC

## 2020-01-23 NOTE — Telephone Encounter (Signed)
Patient notified and verbalized understanding.  New prescription sent to [facility] ([facility]

## 2020-01-23 NOTE — Telephone Encounter (Signed)
Patient also states that he is just getting over Covid & has just went back to work.  States that he will hold off on the stress test for now as the symptoms he was having may have been related to covid.  He will call back to schedule follow up after he is over this.

## 2020-01-29 ENCOUNTER — Other Ambulatory Visit: Payer: Self-pay | Admitting: Cardiology

## 2020-01-30 ENCOUNTER — Telehealth: Payer: Self-pay | Admitting: Cardiology

## 2020-01-30 MED ORDER — METOPROLOL TARTRATE 25 MG PO TABS: ORAL_TABLET | ORAL | 1 refills | Status: DC

## 2020-01-30 NOTE — Telephone Encounter (Signed)
Patient called stating that he was seen December 2021. Wants to know if his medications can be called in for 90 days.

## 2020-01-30 NOTE — Telephone Encounter (Signed)
Medication sent to pharmacy  

## 2020-02-08 ENCOUNTER — Telehealth: Payer: Self-pay | Admitting: Cardiology

## 2020-02-08 NOTE — Telephone Encounter (Signed)
Patient called stating that he does not want to do any type of stress test at present time. He will call the office back if he decides to go forward with test.

## 2020-07-28 ENCOUNTER — Other Ambulatory Visit: Payer: Self-pay | Admitting: Cardiology

## 2021-01-24 ENCOUNTER — Other Ambulatory Visit: Payer: Self-pay | Admitting: Cardiology

## 2021-02-11 ENCOUNTER — Other Ambulatory Visit: Payer: Self-pay | Admitting: *Deleted

## 2021-02-11 ENCOUNTER — Other Ambulatory Visit: Payer: Self-pay | Admitting: Cardiology

## 2021-02-11 MED ORDER — SIMVASTATIN 40 MG PO TABS: 40.0000 mg | ORAL_TABLET | Freq: Every day | ORAL | 0 refills | Status: DC

## 2021-03-15 ENCOUNTER — Other Ambulatory Visit: Payer: Self-pay | Admitting: Cardiology

## 2021-04-02 ENCOUNTER — Other Ambulatory Visit: Payer: Self-pay | Admitting: Cardiology

## 2021-04-23 ENCOUNTER — Encounter: Payer: Self-pay | Admitting: Family Medicine

## 2021-04-23 ENCOUNTER — Ambulatory Visit (INDEPENDENT_AMBULATORY_CARE_PROVIDER_SITE_OTHER): Payer: BC Managed Care – PPO | Admitting: Family Medicine

## 2021-04-23 VITALS — BP 103/63 | HR 64 | Temp 98.3°F | Ht 69.0 in | Wt 210.5 lb

## 2021-04-23 DIAGNOSIS — I25118 Atherosclerotic heart disease of native coronary artery with other forms of angina pectoris: Secondary | ICD-10-CM | POA: Diagnosis not present

## 2021-04-23 DIAGNOSIS — Z7689 Persons encountering health services in other specified circumstances: Secondary | ICD-10-CM

## 2021-04-23 DIAGNOSIS — R7303 Prediabetes: Secondary | ICD-10-CM | POA: Diagnosis not present

## 2021-04-23 DIAGNOSIS — I10 Essential (primary) hypertension: Secondary | ICD-10-CM | POA: Diagnosis not present

## 2021-04-23 DIAGNOSIS — E782 Mixed hyperlipidemia: Secondary | ICD-10-CM | POA: Diagnosis not present

## 2021-04-23 DIAGNOSIS — E6609 Other obesity due to excess calories: Secondary | ICD-10-CM

## 2021-04-23 DIAGNOSIS — Z6831 Body mass index (BMI) 31.0-31.9, adult: Secondary | ICD-10-CM

## 2021-04-23 LAB — CBC WITH DIFFERENTIAL/PLATELET
Basophils Absolute: 0.1 10*3/uL (ref 0.0–0.2)
Basos: 2 %
EOS (ABSOLUTE): 0.3 10*3/uL (ref 0.0–0.4)
Eos: 5 %
Hematocrit: 48.1 % (ref 37.5–51.0)
Hemoglobin: 16.1 g/dL (ref 13.0–17.7)
Immature Grans (Abs): 0 10*3/uL (ref 0.0–0.1)
Immature Granulocytes: 0 %
Lymphocytes Absolute: 1.8 10*3/uL (ref 0.7–3.1)
Lymphs: 37 %
MCH: 30.4 pg (ref 26.6–33.0)
MCHC: 33.5 g/dL (ref 31.5–35.7)
MCV: 91 fL (ref 79–97)
Monocytes Absolute: 0.3 10*3/uL (ref 0.1–0.9)
Monocytes: 6 %
Neutrophils Absolute: 2.4 10*3/uL (ref 1.4–7.0)
Neutrophils: 50 %
Platelets: 167 10*3/uL (ref 150–450)
RBC: 5.3 x10E6/uL (ref 4.14–5.80)
RDW: 12.9 % (ref 11.6–15.4)
WBC: 4.8 10*3/uL (ref 3.4–10.8)

## 2021-04-23 LAB — CMP14+EGFR
ALT: 30 IU/L (ref 0–44)
AST: 24 IU/L (ref 0–40)
Albumin/Globulin Ratio: 1.5 (ref 1.2–2.2)
Albumin: 4.4 g/dL (ref 3.8–4.9)
Alkaline Phosphatase: 97 IU/L (ref 44–121)
BUN/Creatinine Ratio: 17 (ref 9–20)
BUN: 14 mg/dL (ref 6–24)
Bilirubin Total: 0.7 mg/dL (ref 0.0–1.2)
CO2: 22 mmol/L (ref 20–29)
Calcium: 9.4 mg/dL (ref 8.7–10.2)
Chloride: 104 mmol/L (ref 96–106)
Creatinine, Ser: 0.84 mg/dL (ref 0.76–1.27)
Globulin, Total: 2.9 g/dL (ref 1.5–4.5)
Glucose: 252 mg/dL — ABNORMAL HIGH (ref 70–99)
Potassium: 4.2 mmol/L (ref 3.5–5.2)
Sodium: 139 mmol/L (ref 134–144)
Total Protein: 7.3 g/dL (ref 6.0–8.5)
eGFR: 102 mL/min/{1.73_m2} (ref >59)

## 2021-04-23 LAB — LIPID PANEL
Chol/HDL Ratio: 4 ratio (ref 0.0–5.0)
Cholesterol, Total: 152 mg/dL (ref 100–199)
HDL: 38 mg/dL — ABNORMAL LOW (ref >39)
LDL Chol Calc (NIH): 88 mg/dL (ref 0–99)
Triglycerides: 147 mg/dL (ref 0–149)
VLDL Cholesterol Cal: 26 mg/dL (ref 5–40)

## 2021-04-23 LAB — BAYER DCA HB A1C WAIVED: HB A1C (BAYER DCA - WAIVED): 9 % — ABNORMAL HIGH (ref 4.8–5.6)

## 2021-04-23 NOTE — Patient Instructions (Signed)
Hypertension, Adult High blood pressure (hypertension) is when the force of blood pumping through the arteries is too strong. The arteries are the blood vessels that carry blood from the heart throughout the body. Hypertension forces the heart to work harder to pump blood and may cause arteries to become narrow or stiff. Untreated or uncontrolled hypertension can lead to a heart attack, heart failure, a stroke, kidney disease, and other problems. A blood pressure reading consists of a higher number over a lower number. Ideally, your blood pressure should be below 120/80. The first ("top") number is called the systolic pressure. It is a measure of the pressure in your arteries as your heart beats. The second ("bottom") number is called the diastolic pressure. It is a measure of the pressure in your arteries as the heart relaxes. What are the causes? The exact cause of this condition is not known. There are some conditions that result in high blood pressure. What increases the risk? Certain factors may make you more likely to develop high blood pressure. Some of these risk factors are under your control, including: Smoking. Not getting enough exercise or physical activity. Being overweight. Having too much fat, sugar, calories, or salt (sodium) in your diet. Drinking too much alcohol. Other risk factors include: Having a personal history of heart disease, diabetes, high cholesterol, or kidney disease. Stress. Having a family history of high blood pressure and high cholesterol. Having obstructive sleep apnea. Age. The risk increases with age. What are the signs or symptoms? High blood pressure may not cause symptoms. Very high blood pressure (hypertensive crisis) may cause: Headache. Fast or irregular heartbeats (palpitations). Shortness of breath. Nosebleed. Nausea and vomiting. Vision changes. Severe chest pain, dizziness, and seizures. How is this diagnosed? This condition is diagnosed by  measuring your blood pressure while you are seated, with your arm resting on a flat surface, your legs uncrossed, and your feet flat on the floor. The cuff of the blood pressure monitor will be placed directly against the skin of your upper arm at the level of your heart. Blood pressure should be measured at least twice using the same arm. Certain conditions can cause a difference in blood pressure between your right and left arms. If you have a high blood pressure reading during one visit or you have normal blood pressure with other risk factors, you may be asked to: Return on a different day to have your blood pressure checked again. Monitor your blood pressure at home for 1 week or longer. If you are diagnosed with hypertension, you may have other blood or imaging tests to help your health care provider understand your overall risk for other conditions. How is this treated? This condition is treated by making healthy lifestyle changes, such as eating healthy foods, exercising more, and reducing your alcohol intake. You may be referred for counseling on a healthy diet and physical activity. Your health care provider may prescribe medicine if lifestyle changes are not enough to get your blood pressure under control and if: Your systolic blood pressure is above 130. Your diastolic blood pressure is above 80. Your personal target blood pressure may vary depending on your medical conditions, your age, and other factors. Follow these instructions at home: Eating and drinking  Eat a diet that is high in fiber and potassium, and low in sodium, added sugar, and fat. An example of this eating plan is called the DASH diet. DASH stands for Dietary Approaches to Stop Hypertension. To eat this way: Eat   plenty of fresh fruits and vegetables. Try to fill one half of your plate at each meal with fruits and vegetables. Eat whole grains, such as whole-wheat pasta, brown rice, or whole-grain bread. Fill about one  fourth of your plate with whole grains. Eat or drink low-fat dairy products, such as skim milk or low-fat yogurt. Avoid fatty cuts of meat, processed or cured meats, and poultry with skin. Fill about one fourth of your plate with lean proteins, such as fish, chicken without skin, beans, eggs, or tofu. Avoid pre-made and processed foods. These tend to be higher in sodium, added sugar, and fat. Reduce your daily sodium intake. Many people with hypertension should eat less than 1,500 mg of sodium a day. Do not drink alcohol if: Your health care provider tells you not to drink. You are pregnant, may be pregnant, or are planning to become pregnant. If you drink alcohol: Limit how much you have to: 0-1 drink a day for women. 0-2 drinks a day for men. Know how much alcohol is in your drink. In the [place] one drink equals one 12 oz bottle of beer (355 mL), one 5 oz glass of wine (148 mL), or one 1 oz glass of hard liquor (44 mL). Lifestyle  Work with your health care provider to maintain a healthy body weight or to lose weight. Ask what an ideal weight is for you. Get at least 30 minutes of exercise that causes your heart to beat faster (aerobic exercise) most days of the week. Activities may include walking, swimming, or biking. Include exercise to strengthen your muscles (resistance exercise), such as Pilates or lifting weights, as part of your weekly exercise routine. Try to do these types of exercises for 30 minutes at least 3 days a week. Do not use any products that contain nicotine or tobacco. These products include cigarettes, chewing tobacco, and vaping devices, such as e-cigarettes. If you need help quitting, ask your health care provider. Monitor your blood pressure at home as told by your health care provider. Keep all follow-up visits. This is important. Medicines Take over-the-counter and prescription medicines only as told by your health care provider. Follow directions carefully. Blood  pressure medicines must be taken as prescribed. Do not skip doses of blood pressure medicine. Doing this puts you at risk for problems and can make the medicine less effective. Ask your health care provider about side effects or reactions to medicines that you should watch for. Contact a health care provider if you: Think you are having a reaction to a medicine you are taking. Have headaches that keep coming back (recurring). Feel dizzy. Have swelling in your ankles. Have trouble with your vision. Get help right away if you: Develop a severe headache or confusion. Have unusual weakness or numbness. Feel faint. Have severe pain in your chest or abdomen. Vomit repeatedly. Have trouble breathing. These symptoms may be an emergency. Get help right away. Call 911. Do not wait to see if the symptoms will go away. Do not drive yourself to the hospital. Summary Hypertension is when the force of blood pumping through your arteries is too strong. If this condition is not controlled, it may put you at risk for serious complications. Your personal target blood pressure may vary depending on your medical conditions, your age, and other factors. For most people, a normal blood pressure is less than 120/80. Hypertension is treated with lifestyle changes, medicines, or a combination of both. Lifestyle changes include losing weight, eating a healthy,   low-sodium diet, exercising more, and limiting alcohol. This information is not intended to replace advice given to you by your health care provider. Make sure you discuss any questions you have with your health care provider. Document Revised: 10/28/2020 Document Reviewed: 10/28/2020 Elsevier Patient Education  2023 [employer]  

## 2021-04-23 NOTE — Progress Notes (Signed)
? ?New Patient Office Visit ? ?Subjective   ? ?Patient ID: Dennis Terry, male    DOB: 03/01/1965  Age: 56 y.o. MRN: [id number] ? ?CC:  ?Chief Complaint  ?Patient presents with  ? New Patient (Initial Visit)  ? ? ?HPI ?Dennis Terry presents to establish care.  ? ?He is established with cardiology and will have an next month. Cardiology has been managing all of his medications. He does reports that he has been eating a regular diet and has not been exercising.  ? ?He has no concerns today.  ? ?Outpatient Encounter Medications as of 04/23/2021  ?Medication Sig  ? aspirin 81 MG chewable tablet Chew 1 tablet (81 mg total) by mouth daily.  ? metoprolol tartrate (LOPRESSOR) 25 MG tablet Take 1 tablet by mouth twice daily  ? simvastatin (ZOCOR) 40 MG tablet TAKE 1 TABLET BY MOUTH ONCE DAILY. OFFICE VISIT REQUIRED  ? NITROSTAT 0.4 MG SL tablet Place 1 tablet (0.4 mg total) under the tongue every 5 (five) minutes as needed for chest pain. (Patient not taking: Reported on 04/23/2021)  ? [DISCONTINUED] isosorbide mononitrate (IMDUR) 30 MG 24 hr tablet Take 1 tablet (30 mg total) by mouth daily.  ? ?No facility-administered encounter medications on file as of 04/23/2021.  ? ? ?Past Medical History:  ?Diagnosis Date  ? CAD (coronary artery disease), native coronary artery   ? Hyperlipidemia   ? Hypertension   ? Myocardial infarction ([place]) 12/29/2008  ? Pneumonia 02/04/2014  ? "not sure if it was pneumonia or flu"  ? ? ?Past Surgical History:  ?Procedure Laterality Date  ? APPENDECTOMY  ~ 2008  ? CARDIAC CATHETERIZATION  2011  ? CARDIAC CATHETERIZATION N/A 10/04/2014  ? Procedure: Left Heart Cath and Coronary Angiography;  Surgeon: Sherren Mocha, MD;  Location: [facility];  Service: Cardiovascular;  Laterality: N/A;  ? CARDIAC CATHETERIZATION N/A 10/04/2014  ? Procedure: Coronary Stent Intervention;  Surgeon: Sherren Mocha, MD;  Location: [facility];  Service: Cardiovascular;  Laterality: N/A;  ? CORONARY ANGIOPLASTY  WITH STENT PLACEMENT  2010; 10/04/2014  ? FINGER FRACTURE SURGERY Left ~ 2010  ? "middle finger"  ? FOREARM FRACTURE SURGERY Left 1997  ? FRACTURE SURGERY    ? WRIST FRACTURE SURGERY Left 1997  ? w/thumb fracture  ? ? ?Family History  ?Problem Relation Age of Onset  ? Heart failure Father   ? Cancer Other   ? Cancer Mother   ?     pancreatic  ? Colon cancer Neg Hx   ? ? ?Social History  ? ?Socioeconomic History  ? Marital status: Married  ?  Spouse name: Arbie Cookey  ? Number of children: 0  ? Years of education: 28  ? Highest education level: High school graduate  ?Occupational History  ? Not on file  ?Tobacco Use  ? Smoking status: Former  ?  Packs/day: 1.50  ?  Years: 30.00  ?  Pack years: 45.00  ?  Types: Cigarettes  ?  Quit date: 12/29/2008  ?  Years since quitting: 12.3  ? Smokeless tobacco: Never  ?Vaping Use  ? Vaping Use: Never used  ?Substance and Sexual Activity  ? Alcohol use: No  ?  Alcohol/week: 0.0 standard drinks  ?  Comment: 10/04/2014 "none in the 2000's"  ? Drug use: No  ? Sexual activity: Yes  ?  Comment: married 2017  year, been together since2001  ?Other Topics Concern  ? Not on file  ?Social History Narrative  ?  Not on file  ? ?Social Determinants of Health  ? ?Financial Resource Strain: Not on file  ?Food Insecurity: Not on file  ?Transportation Needs: Not on file  ?Physical Activity: Not on file  ?Stress: Not on file  ?Social Connections: Not on file  ?Intimate Partner Violence: Not on file  ? ? ?Review of Systems  ?Constitutional:  Negative for diaphoresis, malaise/fatigue and weight loss.  ?Eyes:  Negative for blurred vision and double vision.  ?Respiratory:  Negative for shortness of breath and wheezing.   ?Cardiovascular:  Negative for chest pain, palpitations, orthopnea, claudication and leg swelling.  ?Neurological:  Negative for dizziness, focal weakness, weakness and headaches.  ? ?  ? ? ?Objective   ? ?BP 103/63   Pulse 64   Temp 98.3 ?F (36.8 ?C) (Temporal)   Ht _0  (1.753 m)   Wt  210 lb 8 oz (95.5 kg)   BMI 31.09 kg/m?  ? ?Physical Exam ?Vitals and nursing note reviewed.  ?Constitutional:   ?   General: He is not in acute distress. ?   Appearance: He is not ill-appearing, toxic-appearing or diaphoretic.  ?HENT:  ?   Nose: Nose normal.  ?   Mouth/Throat:  ?   Mouth: Mucous membranes are moist.  ?   Pharynx: Oropharynx is clear.  ?Eyes:  ?   Extraocular Movements: Extraocular movements intact.  ?   Conjunctiva/sclera: Conjunctivae normal.  ?   Pupils: Pupils are equal, round, and reactive to light.  ?Neck:  ?   Thyroid: No thyroid mass, thyromegaly or thyroid tenderness.  ?   Vascular: No carotid bruit or JVD.  ?Cardiovascular:  ?   Rate and Rhythm: Normal rate and regular rhythm.  ?   Heart sounds: Normal heart sounds. No murmur heard. ?Pulmonary:  ?   Effort: Pulmonary effort is normal. No respiratory distress.  ?   Breath sounds: Normal breath sounds. No wheezing.  ?Abdominal:  ?   General: Bowel sounds are normal. There is no distension.  ?   Palpations: Abdomen is soft.  ?   Tenderness: There is no abdominal tenderness. There is no guarding or rebound.  ?Musculoskeletal:  ?   Right lower leg: No edema.  ?   Left lower leg: No edema.  ?Skin: ?   General: Skin is warm and dry.  ?Neurological:  ?   General: No focal deficit present.  ?   Mental Status: He is alert and oriented to person, place, and time.  ?Psychiatric:     ?   Mood and Affect: Mood normal.     ?   Behavior: Behavior normal.     ?   Thought Content: Thought content normal.     ?   Judgment: Judgment normal.  ? ? ? ?  ? ?Assessment & Plan:  ? ?[name] was seen today for new patient (initial visit). ? ?Diagnoses and all orders for this visit: ? ?Primary hypertension ?Well controlled on current regimen. On metoprolol. Fasting labs pending.  ?-     CBC with Differential/Platelet ?-     CMP14+EGFR ?-     Lipid panel ? ?Prediabetes ?Fasting labs pending. Diet and exercise.  ?-     CBC with Differential/Platelet ?-     CMP14+EGFR ?-      Lipid panel ?-     Bayer DCA Hb A1c Waived ? ?Mixed hyperlipidemia ?On simvastatin. Labs pending.  ?-     Lipid panel ? ?Coronary artery disease of native artery  of native heart with stable angina pectoris ([place]) ?On statin and aspirin.  ?-     Lipid panel ? ?Class 1 obesity due to excess calories with serious comorbidity and body mass index (BMI) of 31.0 to 31.9 in adult ?Diet and exercise.  ? ?Encounter to establish care ? ?Return in about 6 months (around 10/23/2021) for CPE.  ? ?The patient indicates understanding of these issues and agrees with the plan. ? ?Gwenlyn Perking, FNP ? ? ?

## 2021-04-24 ENCOUNTER — Other Ambulatory Visit: Payer: Self-pay | Admitting: Family Medicine

## 2021-04-24 DIAGNOSIS — E1165 Type 2 diabetes mellitus with hyperglycemia: Secondary | ICD-10-CM

## 2021-04-24 DIAGNOSIS — I25118 Atherosclerotic heart disease of native coronary artery with other forms of angina pectoris: Secondary | ICD-10-CM

## 2021-04-24 MED ORDER — DAPAGLIFLOZIN PROPANEDIOL 10 MG PO TABS: 10.0000 mg | ORAL_TABLET | Freq: Every day | ORAL | 1 refills | Status: DC

## 2021-04-24 NOTE — Progress Notes (Signed)
Patient returning call. Please call back around 2, if possible.  ?

## 2021-04-28 ENCOUNTER — Other Ambulatory Visit: Payer: Self-pay | Admitting: Cardiology

## 2021-04-30 ENCOUNTER — Ambulatory Visit (INDEPENDENT_AMBULATORY_CARE_PROVIDER_SITE_OTHER): Payer: BC Managed Care – PPO | Admitting: Cardiology

## 2021-04-30 ENCOUNTER — Encounter: Payer: Self-pay | Admitting: Cardiology

## 2021-04-30 VITALS — BP 120/76 | HR 67 | Ht 69.0 in | Wt 216.8 lb

## 2021-04-30 DIAGNOSIS — I2510 Atherosclerotic heart disease of native coronary artery without angina pectoris: Secondary | ICD-10-CM | POA: Diagnosis not present

## 2021-04-30 DIAGNOSIS — E119 Type 2 diabetes mellitus without complications: Secondary | ICD-10-CM | POA: Diagnosis not present

## 2021-04-30 DIAGNOSIS — E782 Mixed hyperlipidemia: Secondary | ICD-10-CM

## 2021-04-30 MED ORDER — SIMVASTATIN 40 MG PO TABS: 40.0000 mg | ORAL_TABLET | Freq: Every evening | ORAL | 3 refills | Status: DC

## 2021-04-30 MED ORDER — METOPROLOL TARTRATE 25 MG PO TABS: 25.0000 mg | ORAL_TABLET | Freq: Two times a day (BID) | ORAL | 3 refills | Status: DC

## 2021-04-30 NOTE — Patient Instructions (Addendum)
Medication Instructions:  Continue all current medications.   Labwork: none  Testing/Procedures: none  Follow-Up: 6 months   Any Other Special Instructions Will Be Listed Below (If Applicable).   If you need a refill on your cardiac medications before your next appointment, please call your pharmacy.  

## 2021-04-30 NOTE — Progress Notes (Signed)
? ? ? ?Clinical Summary ?Mr. Verona is a 56 y.o.male seen today for follow up of the following medical problems.  ?  ?1. CAD ?- previous NSTEMI 12/2008 where he received a DES to RCA ?- admit 10/2014 with chest pain, trop up to 0.08. LHC showed RCA disease again that received a DES. Normal LVEF by LVgram during that admit. 2010 echo LVEF 55% with moderate to severe inferior hypokinesis.  ?  ?Photosensitivity on both ACEI and ARB ? ?- isolated episode midchest, burning like pain felt like heart burn ? ?  ?  ?2. Hyperlipidemia ?- muscle aches on lipitor and crestor.Tolerates low dose simva ? - 05/2016 TC 146 TG 186 HDL 31 LDL 78 ?- compliant with statin. Back on simva 20mg  daily.  ?  ?07/2017 TC 140 TG 137 HDL 29 LDL 84. Tolerates simva 20mg  daily without troubles.  ?- simva increased to 40mg  daily Jan 2022 ?  ? ?  ?  ?3. DM2  ?- new diagnosis 04/2021, HgbA1c 9 ?- hesitant to start meds, working on aggressive diet changes ? ? ? ? ?Past Medical History:  ?Diagnosis Date  ? CAD (coronary artery disease), native coronary artery   ? Hyperlipidemia   ? Hypertension   ? Myocardial infarction ([place]) 12/29/2008  ? Pneumonia 02/04/2014  ? "not sure if it was pneumonia or flu"  ? ? ? ?Allergies  ?Allergen Reactions  ? Oxycodone-Acetaminophen Shortness Of Breath and Other (See Comments)  ?  Chest tightness and numbness   ? Oxycodone-Acetaminophen Shortness Of Breath and Other (See Comments)  ?  Chest tightness and numbness   ? Lipitor [Atorvastatin] Other (See Comments)  ?  Muscle aches   ? ? ? ?Current Outpatient Medications  ?Medication Sig Dispense Refill  ? aspirin 81 MG chewable tablet Chew 1 tablet (81 mg total) by mouth daily.    ? dapagliflozin propanediol (FARXIGA) 10 MG TABS tablet Take 1 tablet (10 mg total) by mouth daily before breakfast. 90 tablet 1  ? metoprolol tartrate (LOPRESSOR) 25 MG tablet TAKE 1 TABLET BY MOUTH TWICE DAILY . APPOINTMENT REQUIRED FOR FUTURE REFILLS 60 tablet 1  ? NITROSTAT 0.4 MG SL tablet Place  1 tablet (0.4 mg total) under the tongue every 5 (five) minutes as needed for chest pain. (Patient not taking: Reported on 04/23/2021) 25 tablet 4  ? simvastatin (ZOCOR) 40 MG tablet TAKE 1 TABLET BY MOUTH ONCE DAILY. OFFICE VISIT REQUIRED 30 tablet 1  ? ?No current facility-administered medications for this visit.  ? ? ? ?Past Surgical History:  ?Procedure Laterality Date  ? APPENDECTOMY  ~ 2008  ? CARDIAC CATHETERIZATION  2011  ? CARDIAC CATHETERIZATION N/A 10/04/2014  ? Procedure: Left Heart Cath and Coronary Angiography;  Surgeon: Sherren Mocha, MD;  Location: [facility];  Service: Cardiovascular;  Laterality: N/A;  ? CARDIAC CATHETERIZATION N/A 10/04/2014  ? Procedure: Coronary Stent Intervention;  Surgeon: Sherren Mocha, MD;  Location: [facility];  Service: Cardiovascular;  Laterality: N/A;  ? CORONARY ANGIOPLASTY WITH STENT PLACEMENT  2010; 10/04/2014  ? FINGER FRACTURE SURGERY Left ~ 2010  ? "middle finger"  ? FOREARM FRACTURE SURGERY Left 1997  ? FRACTURE SURGERY    ? WRIST FRACTURE SURGERY Left 1997  ? w/thumb fracture  ? ? ? ?Allergies  ?Allergen Reactions  ? Oxycodone-Acetaminophen Shortness Of Breath and Other (See Comments)  ?  Chest tightness and numbness   ? Oxycodone-Acetaminophen Shortness Of Breath and Other (See Comments)  ?  Chest  tightness and numbness   ? Lipitor [Atorvastatin] Other (See Comments)  ?  Muscle aches   ? ? ? ? ?Family History  ?Problem Relation Age of Onset  ? Heart failure Father   ? Cancer Other   ? Cancer Mother   ?     pancreatic  ? Colon cancer Neg Hx   ? ? ? ?Social History ?Mr. Hilling reports that he quit smoking about 12 years ago. His smoking use included cigarettes. He has a 45.00 pack-year smoking history. He has never used smokeless tobacco. ?Mr. Hoyer reports no history of alcohol use. ? ? ?Review of Systems ?CONSTITUTIONAL: No weight loss, fever, chills, weakness or fatigue.  ?HEENT: Eyes: No visual loss, blurred vision, double vision or yellow  sclerae.No hearing loss, sneezing, congestion, runny nose or sore throat.  ?SKIN: No rash or itching.  ?CARDIOVASCULAR: per hpi ?RESPIRATORY: No shortness of breath, cough or sputum.  ?GASTROINTESTINAL: No anorexia, nausea, vomiting or diarrhea. No abdominal pain or blood.  ?GENITOURINARY: No burning on urination, no polyuria ?NEUROLOGICAL: No headache, dizziness, syncope, paralysis, ataxia, numbness or tingling in the extremities. No change in bowel or bladder control.  ?MUSCULOSKELETAL: No muscle, back pain, joint pain or stiffness.  ?LYMPHATICS: No enlarged nodes. No history of splenectomy.  ?PSYCHIATRIC: No history of depression or anxiety.  ?ENDOCRINOLOGIC: No reports of sweating, cold or heat intolerance. No polyuria or polydipsia.  ?. ? ? ?Physical Examination ?Today's Vitals  ? 04/30/21 1425  ?BP: 120/76  ?Pulse: 67  ?SpO2: 96%  ?Weight: 216 lb 12.8 oz (98.3 kg)  ?Height: 5\' 9"  (1.753 m)  ? ?Body mass index is 32.02 kg/m?. ? ?Gen: resting comfortably, no acute distress ?HEENT: no scleral icterus, pupils equal round and reactive, no palptable cervical adenopathy,  ?CV: RRR, no m/r/g no jvd ?Resp: Clear to auscultation bilaterally ?GI: abdomen is soft, non-tender, non-distended, normal bowel sounds, no hepatosplenomegaly ?MSK: extremities are warm, no edema.  ?Skin: warm, no rash ?Neuro:  no focal deficits ?Psych: appropriate affect ? ? ?Diagnostic Studies ?09/2014 Cath ?Mid RCA lesion, 90% stenosed. ?Mid LAD lesion, 40% stenosed. ?The left ventricular systolic function is normal. ?Prox RCA lesion, 60% stenosed. There is a 0% residual stenosis post intervention. The lesion was previously treated with a stent (unknown type) . ?A drug-eluting stent was placed. ?  ?1. Severe single vessel CAD involving the RCA treated successfully with PTCA and stenting (DES) ?2. Mild nonobstructive disease of the LAD ?3. Widely patent LCx ?4. Preserved overall LV function ?  ?Continue DAPT with ASA and Effient x 12 months as  tolerated ? ? ? ?Assessment and Plan  ? ?1. CAD ?- s/p NSTEMI 10/2014 with DES to RCA placed ?- photosensitivyt on ACE I and ARB now off ?- no symptoms, continue current meds ?- EKG today shows NSR, no ischemic changes ?  ?2. Hyperlipidemia ?-did not tolerate lipitor or crestor, back on simva at 40mg  daily.  ?- workonig on aggressive dietary changes, monitor lipids. May consider adding zetia in the future though in general is he reluctant for additional medications.  ? ?3. DM2 ?- new diagnosis, HgbA1c was 9 ?- he is hesitant to start medications, working on aggressive dieatery and lifestyle changes ?- from cardiac stanpdoint he is on ASA, statin. Intolerant to both ACEi and ARBs.  ? ? ?Arnoldo Lenis, M.D. ?

## 2021-05-07 ENCOUNTER — Ambulatory Visit: Payer: BC Managed Care – PPO | Admitting: Pharmacist

## 2021-11-10 ENCOUNTER — Encounter: Payer: Self-pay | Admitting: Cardiology

## 2021-11-10 ENCOUNTER — Ambulatory Visit: Payer: BC Managed Care – PPO | Attending: Cardiology | Admitting: Cardiology

## 2021-11-10 VITALS — BP 116/80 | HR 64 | Ht 69.0 in | Wt 213.0 lb

## 2021-11-10 DIAGNOSIS — E782 Mixed hyperlipidemia: Secondary | ICD-10-CM | POA: Diagnosis not present

## 2021-11-10 DIAGNOSIS — I2510 Atherosclerotic heart disease of native coronary artery without angina pectoris: Secondary | ICD-10-CM | POA: Diagnosis not present

## 2021-11-10 MED ORDER — EZETIMIBE 10 MG PO TABS: 10.0000 mg | ORAL_TABLET | Freq: Every day | ORAL | 1 refills | Status: DC

## 2021-11-10 NOTE — Patient Instructions (Addendum)
Medication Instructions:  Your physician has recommended you make the following change in your medication:  Start Zetia 10 mg once a day Continue all other medications as directed  Labwork: none  Testing/Procedures: none  Follow-Up:  Your physician recommends that you schedule a follow-up appointment in: 6 months  Any Other Special Instructions Will Be Listed Below (If Applicable).  If you need a refill on your cardiac medications before your next appointment, please call your pharmacy.

## 2021-11-10 NOTE — Progress Notes (Signed)
Clinical Summary Mr. Champoux is a 56 y.o.male  seen today for follow up of the following medical problems.    1. CAD - previous NSTEMI 12/2008 where he received a DES to RCA - admit 10/2014 with chest pain, trop up to 0.08. LHC showed RCA disease again that received a DES. Normal LVEF by LVgram during that admit. 2010 echo LVEF 55% with moderate to severe inferior hypokinesis.    Photosensitivity on both ACEI and ARB     - no chest pains, no SOB/DOE - compliant with meds       2. Hyperlipidemia - muscle aches on lipitor and crestor.Tolerates low dose simva  - 05/2016 TC 146 TG 186 HDL 31 LDL 78 - compliant with statin. Back on simva 20mg  daily.    07/2017 TC 140 TG 137 HDL 29 LDL 84. Tolerates simva 20mg  daily without troubles.  - simva increased to 40mg  daily Jan 2022    04/2021 TC 152 TG 147 HDL 38 LDL 88     3. DM2  - new diagnosis 04/2021, HgbA1c 9 -follwed by pcp     Past Medical History:  Diagnosis Date   CAD (coronary artery disease), native coronary artery    Hyperlipidemia    Hypertension    Myocardial infarction ([place]) 12/29/2008   Pneumonia 02/04/2014   "not sure if it was pneumonia or flu"     Allergies  Allergen Reactions   Oxycodone-Acetaminophen Shortness Of Breath and Other (See Comments)    Chest tightness and numbness    Oxycodone-Acetaminophen Shortness Of Breath and Other (See Comments)    Chest tightness and numbness    Lipitor [Atorvastatin] Other (See Comments)    Muscle aches      Current Outpatient Medications  Medication Sig Dispense Refill   aspirin 81 MG chewable tablet Chew 1 tablet (81 mg total) by mouth daily.     dapagliflozin propanediol (FARXIGA) 10 MG TABS tablet Take 1 tablet (10 mg total) by mouth daily before breakfast. 90 tablet 1   metoprolol tartrate (LOPRESSOR) 25 MG tablet Take 1 tablet (25 mg total) by mouth 2 (two) times daily. 180 tablet 3   NITROSTAT 0.4 MG SL tablet Place 1 tablet (0.4 mg total) under the  tongue every 5 (five) minutes as needed for chest pain. 25 tablet 4   simvastatin (ZOCOR) 40 MG tablet Take 1 tablet (40 mg total) by mouth every evening. 90 tablet 3   No current facility-administered medications for this visit.     Past Surgical History:  Procedure Laterality Date   APPENDECTOMY  ~ 2008   CARDIAC CATHETERIZATION  2011   CARDIAC CATHETERIZATION N/A 10/04/2014   Procedure: Left Heart Cath and Coronary Angiography;  Surgeon: Sherren Mocha, MD;  Location: [facility];  Service: Cardiovascular;  Laterality: N/A;   CARDIAC CATHETERIZATION N/A 10/04/2014   Procedure: Coronary Stent Intervention;  Surgeon: Sherren Mocha, MD;  Location: [facility];  Service: Cardiovascular;  Laterality: N/A;   CORONARY ANGIOPLASTY WITH STENT PLACEMENT  2010; 10/04/2014   FINGER FRACTURE SURGERY Left ~ 2010   "middle finger"   FOREARM FRACTURE SURGERY Left [place] Left 1997   w/thumb fracture     Allergies  Allergen Reactions   Oxycodone-Acetaminophen Shortness Of Breath and Other (See Comments)    Chest tightness and numbness    Oxycodone-Acetaminophen Shortness Of Breath and Other (See Comments)    Chest  tightness and numbness    Lipitor [Atorvastatin] Other (See Comments)    Muscle aches       Family History  Problem Relation Age of Onset   Heart failure Father    Cancer Other    Cancer Mother        pancreatic   Colon cancer Neg Hx      Social History Mr. Celani reports that he quit smoking about 12 years ago. His smoking use included cigarettes. He has a 45.00 pack-year smoking history. He has never used smokeless tobacco. Mr. Azure reports no history of alcohol use.   Review of Systems CONSTITUTIONAL: No weight loss, fever, chills, weakness or fatigue.  HEENT: Eyes: No visual loss, blurred vision, double vision or yellow sclerae.No hearing loss, sneezing, congestion, runny nose or sore throat.  SKIN: No rash  or itching.  CARDIOVASCULAR: per hpi RESPIRATORY: No shortness of breath, cough or sputum.  GASTROINTESTINAL: No anorexia, nausea, vomiting or diarrhea. No abdominal pain or blood.  GENITOURINARY: No burning on urination, no polyuria NEUROLOGICAL: No headache, dizziness, syncope, paralysis, ataxia, numbness or tingling in the extremities. No change in bowel or bladder control.  MUSCULOSKELETAL: No muscle, back pain, joint pain or stiffness.  LYMPHATICS: No enlarged nodes. No history of splenectomy.  PSYCHIATRIC: No history of depression or anxiety.  ENDOCRINOLOGIC: No reports of sweating, cold or heat intolerance. No polyuria or polydipsia.  Marland Kitchen   Physical Examination Today's Vitals   11/10/21 0957  BP: 116/80  Pulse: 64  SpO2: 97%  Weight: 213 lb (96.6 kg)  Height: 5\' 9"  (1.753 m)   Body mass index is 31.45 kg/m.  Gen: resting comfortably, no acute distress HEENT: no scleral icterus, pupils equal round and reactive, no palptable cervical adenopathy,  CV: RRR, no m/r/g no jvd Resp: Clear to auscultation bilaterally GI: abdomen is soft, non-tender, non-distended, normal bowel sounds, no hepatosplenomegaly MSK: extremities are warm, no edema.  Skin: warm, no rash Neuro:  no focal deficits Psych: appropriate affect   Diagnostic Studies   09/2014 Cath Mid RCA lesion, 90% stenosed. Mid LAD lesion, 40% stenosed. The left ventricular systolic function is normal. Prox RCA lesion, 60% stenosed. There is a 0% residual stenosis post intervention. The lesion was previously treated with a stent (unknown type) . A drug-eluting stent was placed.   1. Severe single vessel CAD involving the RCA treated successfully with PTCA and stenting (DES) 2. Mild nonobstructive disease of the LAD 3. Widely patent LCx 4. Preserved overall LV function   Continue DAPT with ASA and Effient x 12 months as tolerated    Assessment and Plan   1. CAD - s/p NSTEMI 10/2014 with DES to RCA placed -  photosensitivyt on ACE I and ARB now off - no symptoms, continue current meds   2. Hyperlipidemia -did not tolerate lipitor or crestor, back on simva at 40mg  daily.  - LDL remains above goal, add zetia 10mg  daily.    F/u 37months        , M.D

## 2022-03-07 ENCOUNTER — Other Ambulatory Visit: Payer: Self-pay | Admitting: Cardiology

## 2022-05-17 ENCOUNTER — Encounter: Payer: Self-pay | Admitting: Cardiology

## 2022-05-17 ENCOUNTER — Ambulatory Visit: Payer: BC Managed Care – PPO | Attending: Cardiology | Admitting: Cardiology

## 2022-05-17 VITALS — BP 110/60 | HR 63 | Ht 69.0 in | Wt 204.8 lb

## 2022-05-17 DIAGNOSIS — I10 Essential (primary) hypertension: Secondary | ICD-10-CM

## 2022-05-17 DIAGNOSIS — E782 Mixed hyperlipidemia: Secondary | ICD-10-CM | POA: Diagnosis not present

## 2022-05-17 DIAGNOSIS — E119 Type 2 diabetes mellitus without complications: Secondary | ICD-10-CM

## 2022-05-17 DIAGNOSIS — I2510 Atherosclerotic heart disease of native coronary artery without angina pectoris: Secondary | ICD-10-CM

## 2022-05-17 DIAGNOSIS — Z79899 Other long term (current) drug therapy: Secondary | ICD-10-CM | POA: Diagnosis not present

## 2022-05-17 NOTE — Progress Notes (Signed)
Clinical Summary Dennis Terry is a 57 y.o.male seen today for follow up of the following medical problems.    1. CAD - previous NSTEMI 12/2008 where he received a DES to RCA - admit 10/2014 with chest pain, trop up to 0.08. LHC showed RCA disease again that received a DES. Normal LVEF by LVgram during that admit. 2010 echo LVEF 55% with moderate to severe inferior hypokinesis.    Photosensitivity on both ACEI and ARB       - no chest pains, no SOB/DOE - compliant with meds       2. Hyperlipidemia - muscle aches on lipitor and crestor.Tolerates low dose simva  - 05/2016 TC 146 TG 186 HDL 31 LDL 78 - compliant with statin. Back on simva 20mg  daily.    07/2017 TC 140 TG 137 HDL 29 LDL 84. Tolerates simva 20mg  daily without troubles.  - simva increased to 40mg  daily Jan 2022    04/2021 TC 152 TG 147 HDL 38 LDL 88 - based on this panel zetia 10mg  was added     3. DM2  - new diagnosis 04/2021, HgbA1c 9 -follwed by pcp Past Medical History:  Diagnosis Date   CAD (coronary artery disease), native coronary artery    Hyperlipidemia    Hypertension    Myocardial infarction (HCC) 12/29/2008   Pneumonia 02/04/2014   "not sure if it was pneumonia or flu"     Allergies  Allergen Reactions   Oxycodone-Acetaminophen Shortness Of Breath and Other (See Comments)    Chest tightness and numbness    Oxycodone-Acetaminophen Shortness Of Breath and Other (See Comments)    Chest tightness and numbness    Lipitor [Atorvastatin] Other (See Comments)    Muscle aches      Current Outpatient Medications  Medication Sig Dispense Refill   aspirin 81 MG chewable tablet Chew 1 tablet (81 mg total) by mouth daily.     dapagliflozin propanediol (FARXIGA) 10 MG TABS tablet Take 1 tablet (10 mg total) by mouth daily before breakfast. (Patient not taking: Reported on 11/10/2021) 90 tablet 1   ezetimibe (ZETIA) 10 MG tablet Take 1 tablet by mouth once daily 90 tablet 1   metoprolol tartrate  (LOPRESSOR) 25 MG tablet Take 1 tablet (25 mg total) by mouth 2 (two) times daily. 180 tablet 3   NITROSTAT 0.4 MG SL tablet Place 1 tablet (0.4 mg total) under the tongue every 5 (five) minutes as needed for chest pain. (Patient taking differently: Place 0.4 mg under the tongue every 5 (five) minutes x 3 doses as needed for chest pain.) 25 tablet 4   simvastatin (ZOCOR) 40 MG tablet Take 1 tablet (40 mg total) by mouth every evening. 90 tablet 3   No current facility-administered medications for this visit.     Past Surgical History:  Procedure Laterality Date   APPENDECTOMY  ~ 2008   CARDIAC CATHETERIZATION  2011   CARDIAC CATHETERIZATION N/A 10/04/2014   Procedure: Left Heart Cath and Coronary Angiography;  Surgeon: Tonny Bollman, MD;  Location: [facility] INVASIVE CV LAB;  Service: Cardiovascular;  Laterality: N/A;   CARDIAC CATHETERIZATION N/A 10/04/2014   Procedure: Coronary Stent Intervention;  Surgeon: Tonny Bollman, MD;  Location: [facility] INVASIVE CV LAB;  Service: Cardiovascular;  Laterality: N/A;   CORONARY ANGIOPLASTY WITH STENT PLACEMENT  2010; 10/04/2014   FINGER FRACTURE SURGERY Left ~ 2010   "middle finger"   FOREARM FRACTURE SURGERY Left 1997   FRACTURE SURGERY  WRIST FRACTURE SURGERY Left 1997   w/thumb fracture     Allergies  Allergen Reactions   Oxycodone-Acetaminophen Shortness Of Breath and Other (See Comments)    Chest tightness and numbness    Oxycodone-Acetaminophen Shortness Of Breath and Other (See Comments)    Chest tightness and numbness    Lipitor [Atorvastatin] Other (See Comments)    Muscle aches       Family History  Problem Relation Age of Onset   Heart failure Father    Cancer Other    Cancer Mother        pancreatic   Colon cancer Neg Hx      Social History Dennis Terry reports that he quit smoking about 13 years ago. His smoking use included cigarettes. He has a 45.00 pack-year smoking history. He has never used smokeless tobacco. Dennis Terry  reports no history of alcohol use.   Review of Systems CONSTITUTIONAL: No weight loss, fever, chills, weakness or fatigue.  HEENT: Eyes: No visual loss, blurred vision, double vision or yellow sclerae.No hearing loss, sneezing, congestion, runny nose or sore throat.  SKIN: No rash or itching.  CARDIOVASCULAR: per hpi RESPIRATORY: No shortness of breath, cough or sputum.  GASTROINTESTINAL: No anorexia, nausea, vomiting or diarrhea. No abdominal pain or blood.  GENITOURINARY: No burning on urination, no polyuria NEUROLOGICAL: No headache, dizziness, syncope, paralysis, ataxia, numbness or tingling in the extremities. No change in bowel or bladder control.  MUSCULOSKELETAL: No muscle, back pain, joint pain or stiffness.  LYMPHATICS: No enlarged nodes. No history of splenectomy.  PSYCHIATRIC: No history of depression or anxiety.  ENDOCRINOLOGIC: No reports of sweating, cold or heat intolerance. No polyuria or polydipsia.  Marland Kitchen   Physical Examination Today's Vitals   05/17/22 1141  BP: 110/60  Pulse: 63  SpO2: 94%  Weight: 204 lb 12.8 oz (92.9 kg)  Height: 5\' 9"  (1.753 m)   Body mass index is 30.24 kg/m.  Gen: resting comfortably, no acute distress HEENT: no scleral icterus, pupils equal round and reactive, no palptable cervical adenopathy,  CV: RRR, 2/6 systolic murmur apex Resp: Clear to auscultation bilaterally GI: abdomen is soft, non-tender, non-distended, normal bowel sounds, no hepatosplenomegaly MSK: extremities are warm, no edema.  Skin: warm, no rash Neuro:  no focal deficits Psych: appropriate affect   Diagnostic Studies 09/2014 Cath Mid RCA lesion, 90% stenosed. Mid LAD lesion, 40% stenosed. The left ventricular systolic function is normal. Prox RCA lesion, 60% stenosed. There is a 0% residual stenosis post intervention. The lesion was previously treated with a stent (unknown type) . A drug-eluting stent was placed.   1. Severe single vessel CAD involving the RCA  treated successfully with PTCA and stenting (DES) 2. Mild nonobstructive disease of the LAD 3. Widely patent LCx 4. Preserved overall LV function   Continue DAPT with ASA and Effient x 12 months as tolerated      Assessment and Plan   1. CAD - s/p NSTEMI 10/2014 with DES to RCA placed - photosensitivyt on ACE I and ARB now off - no recent symptoms, continue current meds - EKG today shows NSR, no ischemic changes.    2. Hyperlipidemia -did not tolerate lipitor or crestor, back on simva at 40mg  daily and most recently zetia 10mg  daily was added - repeat lipid panel  3. DM2 --glycemic control per pcp - from cardiac stanpdoint he is on statin. Did not tolerate ACE/ARB/   F/u 6 months     Antoine Poche, M.D.

## 2022-05-17 NOTE — Patient Instructions (Signed)
Medication Instructions:   Continue all current medications.   Labwork:  CMET, FLP, CBC, TSH, Mg, HgA1c - orders given Reminder:  Nothing to eat or drink after 12 midnight prior to labs. Office will contact with results via phone, letter or mychart.     Testing/Procedures:  none  Follow-Up:  6 months   Any Other Special Instructions Will Be Listed Below (If Applicable).   If you need a refill on your cardiac medications before your next appointment, please call your pharmacy.

## 2022-05-20 ENCOUNTER — Other Ambulatory Visit: Payer: BC Managed Care – PPO

## 2022-05-21 LAB — COMPREHENSIVE METABOLIC PANEL
ALT: 29 IU/L (ref 0–44)
AST: 27 IU/L (ref 0–40)
Albumin/Globulin Ratio: 1.7 (ref 1.2–2.2)
Albumin: 4.2 g/dL (ref 3.8–4.9)
Alkaline Phosphatase: 98 IU/L (ref 44–121)
BUN/Creatinine Ratio: 12 (ref 9–20)
BUN: 10 mg/dL (ref 6–24)
Bilirubin Total: 0.6 mg/dL (ref 0.0–1.2)
CO2: 22 mmol/L (ref 20–29)
Calcium: 9 mg/dL (ref 8.7–10.2)
Chloride: 103 mmol/L (ref 96–106)
Creatinine, Ser: 0.83 mg/dL (ref 0.76–1.27)
Globulin, Total: 2.5 g/dL (ref 1.5–4.5)
Glucose: 279 mg/dL — ABNORMAL HIGH (ref 70–99)
Potassium: 4.1 mmol/L (ref 3.5–5.2)
Sodium: 137 mmol/L (ref 134–144)
Total Protein: 6.7 g/dL (ref 6.0–8.5)
eGFR: 101 mL/min/{1.73_m2} (ref >59)

## 2022-05-21 LAB — CBC
Hematocrit: 46.2 % (ref 37.5–51.0)
Hemoglobin: 15.8 g/dL (ref 13.0–17.7)
MCH: 30 pg (ref 26.6–33.0)
MCHC: 34.2 g/dL (ref 31.5–35.7)
MCV: 88 fL (ref 79–97)
Platelets: 172 10*3/uL (ref 150–450)
RBC: 5.27 x10E6/uL (ref 4.14–5.80)
RDW: 12.8 % (ref 11.6–15.4)
WBC: 5 10*3/uL (ref 3.4–10.8)

## 2022-05-21 LAB — LIPID PANEL
Chol/HDL Ratio: 3.9 ratio (ref 0.0–5.0)
Cholesterol, Total: 135 mg/dL (ref 100–199)
HDL: 35 mg/dL — ABNORMAL LOW (ref >39)
LDL Chol Calc (NIH): 68 mg/dL (ref 0–99)
Triglycerides: 191 mg/dL — ABNORMAL HIGH (ref 0–149)
VLDL Cholesterol Cal: 32 mg/dL (ref 5–40)

## 2022-05-21 LAB — HEMOGLOBIN A1C
Est. average glucose Bld gHb Est-mCnc: 289 mg/dL
Hgb A1c MFr Bld: 11.7 % — ABNORMAL HIGH (ref 4.8–5.6)

## 2022-05-21 LAB — MAGNESIUM: Magnesium: 2 mg/dL (ref 1.6–2.3)

## 2022-05-21 LAB — TSH: TSH: 1.79 u[IU]/mL (ref 0.450–4.500)

## 2022-06-07 ENCOUNTER — Other Ambulatory Visit: Payer: Self-pay | Admitting: Cardiology

## 2022-06-21 ENCOUNTER — Telehealth: Payer: Self-pay | Admitting: *Deleted

## 2022-06-21 NOTE — Telephone Encounter (Signed)
-----   Message from Antoine Poche, MD sent at 06/07/2022 10:44 AM EDT ----- Cholesterol is improving, reasonable levels for now given limitations on treatment due to side effects on stronger statins. Diabetes poorly controled, needs to f/u with pcp.

## 2022-06-21 NOTE — Telephone Encounter (Signed)
Lesle Chris, LPN [id number]/6295  2:84 PM EDT Back to Top    Notified.  States he is currently looking for new pcp & will notify us as soon as he gets one.

## 2022-11-05 ENCOUNTER — Encounter: Payer: Self-pay | Admitting: Cardiology

## 2022-11-05 ENCOUNTER — Ambulatory Visit: Payer: BC Managed Care – PPO | Attending: Cardiology | Admitting: Cardiology

## 2022-11-05 VITALS — BP 114/60 | HR 60 | Ht 69.0 in | Wt 201.0 lb

## 2022-11-05 DIAGNOSIS — E782 Mixed hyperlipidemia: Secondary | ICD-10-CM | POA: Diagnosis not present

## 2022-11-05 DIAGNOSIS — E119 Type 2 diabetes mellitus without complications: Secondary | ICD-10-CM | POA: Diagnosis not present

## 2022-11-05 DIAGNOSIS — I2510 Atherosclerotic heart disease of native coronary artery without angina pectoris: Secondary | ICD-10-CM | POA: Diagnosis not present

## 2022-11-05 NOTE — Patient Instructions (Signed)
Medication Instructions:   Continue all current medications.   Labwork:  none  Testing/Procedures:  none  Follow-Up:  6 months   Any Other Special Instructions Will Be Listed Below (If Applicable).  You have been referred to:  Endocrinology   If you need a refill on your cardiac medications before your next appointment, please call your pharmacy.

## 2022-11-05 NOTE — Addendum Note (Signed)
Addended by: Lesle Chris on: 11/05/2022 09:19 AM   Modules accepted: Orders

## 2022-11-05 NOTE — Progress Notes (Signed)
Clinical Summary Dennis Terry is a 57 y.o.male seen today for follow up of the following medical problems.    1. CAD - previous NSTEMI 12/2008 where he received a DES to RCA - admit 10/2014 with chest pain, trop up to 0.08. LHC showed RCA disease again that received a DES. Normal LVEF by LVgram during that admit. 2010 echo LVEF 55% with moderate to severe inferior hypokinesis.    Photosensitivity on both ACEI and ARB     - no chest pains, no SOB/DOE - compliant with meds       2. Hyperlipidemia - muscle aches on lipitor and crestor.Tolerates low dose simva  - 05/2016 TC 146 TG 186 HDL 31 LDL 78 - compliant with statin. Back on simva 20mg  daily.    07/2017 TC 140 TG 137 HDL 29 LDL 84. Tolerates simva 20mg  daily without troubles.  - simva increased to 40mg  daily Jan 2022    04/2021 TC 152 TG 147 HDL 38 LDL 88 - based on this panel zetia 10mg  was added  05/2022 TC 135 TG 191 HDL 35 LDL 68     3. DM2  - new diagnosis 04/2021, HgbA1c 9 -follwed by pcp - 05/2022 A1c 11.7    Past Medical History:  Diagnosis Date   CAD (coronary artery disease), native coronary artery    Hyperlipidemia    Hypertension    Myocardial infarction (HCC) 12/29/2008   Pneumonia 02/04/2014   "not sure if it was pneumonia or flu"     Allergies  Allergen Reactions   Oxycodone-Acetaminophen Shortness Of Breath and Other (See Comments)    Chest tightness and numbness    Oxycodone-Acetaminophen Other (See Comments) and Shortness Of Breath    Chest tightness and numbness  Other Reaction(s): Other (See Comments)  Chest tightness and numbness     Chest tightness and numbness   Lipitor [Atorvastatin] Other (See Comments)    Muscle aches      Current Outpatient Medications  Medication Sig Dispense Refill   aspirin 81 MG chewable tablet Chew 1 tablet (81 mg total) by mouth daily.     dapagliflozin propanediol (FARXIGA) 10 MG TABS tablet Take 1 tablet (10 mg total) by mouth daily before  breakfast. 90 tablet 1   ezetimibe (ZETIA) 10 MG tablet Take 1 tablet by mouth once daily 90 tablet 1   metoprolol tartrate (LOPRESSOR) 25 MG tablet Take 1 tablet by mouth twice daily 180 tablet 1   NITROSTAT 0.4 MG SL tablet Place 1 tablet (0.4 mg total) under the tongue every 5 (five) minutes as needed for chest pain. 25 tablet 4   simvastatin (ZOCOR) 40 MG tablet TAKE 1 TABLET BY MOUTH ONCE DAILY IN THE EVENING 90 tablet 1   No current facility-administered medications for this visit.     Past Surgical History:  Procedure Laterality Date   APPENDECTOMY  ~ 2008   CARDIAC CATHETERIZATION  2011   CARDIAC CATHETERIZATION N/A 10/04/2014   Procedure: Left Heart Cath and Coronary Angiography;  Surgeon: Tonny Bollman, MD;  Location: [facility] INVASIVE CV LAB;  Service: Cardiovascular;  Laterality: N/A;   CARDIAC CATHETERIZATION N/A 10/04/2014   Procedure: Coronary Stent Intervention;  Surgeon: Tonny Bollman, MD;  Location: [facility] INVASIVE CV LAB;  Service: Cardiovascular;  Laterality: N/A;   CORONARY ANGIOPLASTY WITH STENT PLACEMENT  2010; 10/04/2014   FINGER FRACTURE SURGERY Left ~ 2010   "middle finger"   FOREARM FRACTURE SURGERY Left 1997   FRACTURE SURGERY  WRIST FRACTURE SURGERY Left 1997   w/thumb fracture     Allergies  Allergen Reactions   Oxycodone-Acetaminophen Shortness Of Breath and Other (See Comments)    Chest tightness and numbness    Oxycodone-Acetaminophen Other (See Comments) and Shortness Of Breath    Chest tightness and numbness  Other Reaction(s): Other (See Comments)  Chest tightness and numbness     Chest tightness and numbness   Lipitor [Atorvastatin] Other (See Comments)    Muscle aches       Family History  Problem Relation Age of Onset   Heart failure Father    Cancer Other    Cancer Mother        pancreatic   Colon cancer Neg Hx      Social History Dennis Terry reports that he quit smoking about 13 years ago. His smoking use included cigarettes. He  started smoking about 43 years ago. He has a 45 pack-year smoking history. He has never used smokeless tobacco. Dennis Terry reports no history of alcohol use.   Review of Systems CONSTITUTIONAL: No weight loss, fever, chills, weakness or fatigue.  HEENT: Eyes: No visual loss, blurred vision, double vision or yellow sclerae.No hearing loss, sneezing, congestion, runny nose or sore throat.  SKIN: No rash or itching.  CARDIOVASCULAR: per hpi RESPIRATORY: No shortness of breath, cough or sputum.  GASTROINTESTINAL: No anorexia, nausea, vomiting or diarrhea. No abdominal pain or blood.  GENITOURINARY: No burning on urination, no polyuria NEUROLOGICAL: No headache, dizziness, syncope, paralysis, ataxia, numbness or tingling in the extremities. No change in bowel or bladder control.  MUSCULOSKELETAL: No muscle, back pain, joint pain or stiffness.  LYMPHATICS: No enlarged nodes. No history of splenectomy.  PSYCHIATRIC: No history of depression or anxiety.  ENDOCRINOLOGIC: No reports of sweating, cold or heat intolerance. No polyuria or polydipsia.  Marland Kitchen   Physical Examination Today's Vitals   11/05/22 0839  BP: 114/60  Pulse: 60  SpO2: 97%  Weight: 201 lb (91.2 kg)  Height: 5\' 9"  (1.753 m)   Body mass index is 29.68 kg/m.  Gen: resting comfortably, no acute distress HEENT: no scleral icterus, pupils equal round and reactive, no palptable cervical adenopathy,  CV: RRR, no m/rg, no jvd Resp: Clear to auscultation bilaterally GI: abdomen is soft, non-tender, non-distended, normal bowel sounds, no hepatosplenomegaly MSK: extremities are warm, no edema.  Skin: warm, no rash Neuro:  no focal deficits Psych: appropriate affect   Diagnostic Studies  09/2014 Cath Mid RCA lesion, 90% stenosed. Mid LAD lesion, 40% stenosed. The left ventricular systolic function is normal. Prox RCA lesion, 60% stenosed. There is a 0% residual stenosis post intervention. The lesion was previously treated with a  stent (unknown type) . A drug-eluting stent was placed.   1. Severe single vessel CAD involving the RCA treated successfully with PTCA and stenting (DES) 2. Mild nonobstructive disease of the LAD 3. Widely patent LCx 4. Preserved overall LV function   Continue DAPT with ASA and Effient x 12 months as tolerated         Assessment and Plan  1. CAD - s/p NSTEMI 10/2014 with DES to RCA placed - photosensitivyt on ACE I and ARB now off - denies any symptoms, continue current meds   2. Hyperlipidemia -did not tolerate lipitor or crestor, back on simva at 40mg  daily and most recently zetia 10mg  daily was added - LDL reasonable given medication limitations, continue current therapy.    3. DM2 -poorly controlled - he is in  process of transitioning pcp's and does not have regular management at this time. We will refer to endocrinology    F/u 6 months  Antoine Poche, M.D.

## 2022-11-11 ENCOUNTER — Other Ambulatory Visit: Payer: Self-pay | Admitting: Cardiology

## 2023-02-17 ENCOUNTER — Encounter: Payer: BC Managed Care – PPO | Admitting: Nurse Practitioner

## 2023-02-17 NOTE — Progress Notes (Unsigned)
Erroneous encounter

## 2023-02-17 NOTE — Patient Instructions (Signed)

## 2023-06-24 ENCOUNTER — Telehealth: Payer: Self-pay | Admitting: Cardiology

## 2023-06-24 NOTE — Telephone Encounter (Signed)
 Patient returned RN's call.

## 2023-06-24 NOTE — Telephone Encounter (Signed)
 Patient calling to schedule f/u

## 2023-08-05 ENCOUNTER — Telehealth: Payer: Self-pay | Admitting: *Deleted

## 2023-08-05 NOTE — Transitions of Care (Post Inpatient/ED Visit) (Signed)
   08/05/2023  Name: Dennis Terry MRN: [id number] DOB: May 08, 1965  Today's TOC FU Call Status: Today's TOC FU Call Status:: Unsuccessful Call (1st Attempt) Unsuccessful Call (1st Attempt) Date: 08/05/23  Attempted to reach the patient regarding the most recent Inpatient/ED visit.  Follow Up Plan: Additional outreach attempts will be made to reach the patient to complete the Transitions of Care (Post Inpatient/ED visit) call.   Mliss Creed [facility], BSN RN Care Manager/ Transition of Care [place]/ [facility] [phone]

## 2023-08-08 ENCOUNTER — Telehealth: Payer: Self-pay

## 2023-08-08 NOTE — Transitions of Care (Post Inpatient/ED Visit) (Signed)
   08/08/2023  Name: Dennis Terry MRN: [id number] DOB: 07/19/1965  Today's TOC FU Call Status: Today's TOC FU Call Status:: Unsuccessful Call (2nd Attempt) Unsuccessful Call (2nd Attempt) Date: 08/08/23  Attempted to reach the patient regarding the most recent Inpatient/ED visit.  Follow Up Plan: Additional outreach attempts will be made to reach the patient to complete the Transitions of Care (Post Inpatient/ED visit) call.   Shona Prow RN, CCM [place]  [facility] RN Care Manager [phone]

## 2023-08-10 ENCOUNTER — Other Ambulatory Visit: Payer: Self-pay | Admitting: Cardiology

## 2023-08-26 ENCOUNTER — Encounter: Payer: Self-pay | Admitting: Cardiology

## 2023-08-26 ENCOUNTER — Ambulatory Visit: Attending: Cardiology | Admitting: Cardiology

## 2023-08-26 ENCOUNTER — Other Ambulatory Visit (HOSPITAL_BASED_OUTPATIENT_CLINIC_OR_DEPARTMENT_OTHER): Payer: Self-pay

## 2023-08-26 VITALS — BP 118/70 | HR 60 | Ht 69.0 in

## 2023-08-26 DIAGNOSIS — I2510 Atherosclerotic heart disease of native coronary artery without angina pectoris: Secondary | ICD-10-CM

## 2023-08-26 DIAGNOSIS — E119 Type 2 diabetes mellitus without complications: Secondary | ICD-10-CM

## 2023-08-26 DIAGNOSIS — R011 Cardiac murmur, unspecified: Secondary | ICD-10-CM

## 2023-08-26 DIAGNOSIS — E782 Mixed hyperlipidemia: Secondary | ICD-10-CM

## 2023-08-26 DIAGNOSIS — R0989 Other specified symptoms and signs involving the circulatory and respiratory systems: Secondary | ICD-10-CM

## 2023-08-26 MED ORDER — NITROGLYCERIN 0.4 MG SL SUBL
0.4000 mg | SUBLINGUAL_TABLET | SUBLINGUAL | 2 refills | Status: AC | PRN
  Filled 2023-08-26: qty 25, 8d supply, fill #0
  Filled 2023-08-26: qty 25, 1d supply, fill #0

## 2023-08-26 MED ORDER — NITROGLYCERIN 0.4 MG SL SUBL
0.4000 mg | SUBLINGUAL_TABLET | SUBLINGUAL | 4 refills | Status: DC | PRN
  Filled 2023-08-26: qty 25, 8d supply, fill #0

## 2023-08-26 NOTE — Patient Instructions (Addendum)
 Medication Instructions:  Your physician recommends that you continue on your current medications as directed. Please refer to the Current Medication list given to you today.  Stop taking Farxiga   Labwork: None  Testing/Procedures: Your physician has requested that you have an echocardiogram. Echocardiography is a painless test that uses sound waves to create images of your heart. It provides your doctor with information about the size and shape of your heart and how well your heart's chambers and valves are working. This procedure takes approximately one hour. There are no restrictions for this procedure. Please do NOT wear cologne, perfume, aftershave, or lotions (deodorant is allowed). Please arrive 15 minutes prior to your appointment time.  Please note: We ask at that you not bring children with you during ultrasound (echo/ vascular) testing. Due to room size and safety concerns, children are not allowed in the ultrasound rooms during exams. Our front office staff cannot provide observation of children in our lobby area while testing is being conducted. An adult accompanying a patient to their appointment will only be allowed in the ultrasound room at the discretion of the ultrasound technician under special circumstances. We apologize for any inconvenience.  Your physician has requested that you have a carotid duplex. This test is an ultrasound of the carotid arteries in your neck. It looks at blood flow through these arteries that supply the brain with blood. Allow one hour for this exam. There are no restrictions or special instructions.   Follow-Up: Your physician recommends that you schedule a follow-up appointment in: 6 months  Any Other Special Instructions Will Be Listed Below (If Applicable). Referral to Endocrinology   Thank you for choosing [facility]!     If you need a refill on your cardiac medications before your next appointment, please call your pharmacy.

## 2023-08-26 NOTE — Progress Notes (Signed)
 Clinical Summary Mr. Winkleman is a 58 y.o.male seen today for follow up of the following medical problems.    1. CAD - previous NSTEMI 12/2008 where he received a DES to RCA - admit 10/2014 with chest pain, trop up to 0.08. LHC showed RCA disease again that received a DES. Normal LVEF by LVgram during that admit. 2010 echo LVEF 55% with moderate to severe inferior hypokinesis.    Photosensitivity on both ACEI and ARB     -no chest pains, no SOB/DOE - compliant with meds       2. Hyperlipidemia - muscle aches on lipitor and crestor.Tolerates low dose simva  - 05/2016 TC 146 TG 186 HDL 31 LDL 78 - compliant with statin. Back on simva 20mg  daily.    07/2017 TC 140 TG 137 HDL 29 LDL 84. Tolerates simva 20mg  daily without troubles.  - simva increased to 40mg  daily Jan 2022    04/2021 TC 152 TG 147 HDL 38 LDL 88 - based on this panel zetia  10mg  was added  05/2022 TC 135 TG 191 HDL 35 LDL 68 - due for repeat panel     3. 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[employer], and also maintence at [employer] Past Medical History:  Diagnosis Date   CAD (coronary artery disease), native coronary artery    Hyperlipidemia    Hypertension    Myocardial infarction (HCC) 12/29/2008   Pneumonia 02/04/2014   not sure if it was pneumonia or flu     Allergies  Allergen Reactions   Oxycodone-Acetaminophen  Shortness Of Breath and Other (See Comments)    Chest tightness and numbness    Oxycodone-Acetaminophen  Other (See Comments) and Shortness Of Breath    Chest tightness and numbness  Other Reaction(s): Other (See Comments)  Chest tightness and numbness     Chest tightness and numbness   Lipitor [Atorvastatin ] Other (See Comments)    Muscle aches      Current Outpatient Medications  Medication Sig Dispense  Refill   aspirin  81 MG chewable tablet Chew 1 tablet (81 mg total) by mouth daily.     ezetimibe  (ZETIA ) 10 MG tablet Take 1 tablet by mouth once daily 90 tablet 3   metoprolol  tartrate (LOPRESSOR ) 25 MG tablet Take 1 tablet by mouth twice daily 180 tablet 2   nitroGLYCERIN  (NITROSTAT ) 0.4 MG SL tablet Place 1 tablet (0.4 mg total) under the tongue every 5 (five) minutes x 3 doses as needed for chest pain (if no relief after 2nd dose, proceed to ED or call 911). 25 tablet 4   simvastatin  (ZOCOR ) 40 MG tablet TAKE 1 TABLET BY MOUTH ONCE DAILY IN THE EVENING 90 tablet 2   dapagliflozin  propanediol (FARXIGA ) 10 MG TABS tablet Take 1 tablet (10 mg total) by mouth daily before breakfast. (Patient not taking: Reported on 08/26/2023) 90 tablet 1   No current facility-administered medications for this visit.     Past Surgical History:  Procedure Laterality Date   APPENDECTOMY  ~ 2008   CARDIAC CATHETERIZATION  2011   CARDIAC CATHETERIZATION N/A 10/04/2014   Procedure: Left Heart Cath and Coronary Angiography;  Surgeon: Ozell Fell, MD;  Location: [facility] INVASIVE CV LAB;  Service: Cardiovascular;  Laterality: N/A;   CARDIAC CATHETERIZATION N/A 10/04/2014  Procedure: Coronary Stent Intervention;  Surgeon: Ozell Fell, MD;  Location: [facility] INVASIVE CV LAB;  Service: Cardiovascular;  Laterality: N/A;   CORONARY ANGIOPLASTY WITH STENT PLACEMENT  2010; 10/04/2014   FINGER FRACTURE SURGERY Left ~ 2010   middle finger   FOREARM FRACTURE SURGERY Left 1997   FRACTURE SURGERY     WRIST FRACTURE SURGERY Left 1997   w/thumb fracture     Allergies  Allergen Reactions   Oxycodone-Acetaminophen  Shortness Of Breath and Other (See Comments)    Chest tightness and numbness    Oxycodone-Acetaminophen  Other (See Comments) and Shortness Of Breath    Chest tightness and numbness  Other Reaction(s): Other (See Comments)  Chest tightness and numbness     Chest tightness and numbness   Lipitor [Atorvastatin ]  Other (See Comments)    Muscle aches       Family History  Problem Relation Age of Onset   Heart failure Father    Cancer Other    Cancer Mother        pancreatic   Colon cancer Neg Hx      Social History Mr. Joynt reports that he quit smoking about 14 years ago. His smoking use included cigarettes. He started smoking about 44 years ago. He has a 45 pack-year smoking history. He has never used smokeless tobacco. Mr. Hiney reports no history of alcohol use.    Physical Examination Today's Vitals   08/26/23 1133 08/26/23 1218  BP: (!) 144/90 118/70  Pulse: 60   SpO2: 97%   Height: 5' 9 (1.753 m)    Body mass index is 29.68 kg/m.  Gen: resting comfortably, no acute distress HEENT: no scleral icterus, pupils equal round and reactive, no palptable cervical adenopathy,  CV: RRR, no m/rg, no jvd Resp: Clear to auscultation bilaterally GI: abdomen is soft, non-tender, non-distended, normal bowel sounds, no hepatosplenomegaly MSK: extremities are warm, no edema.  Skin: warm, no rash Neuro:  no focal deficits Psych: appropriate affect   Diagnostic Studies 09/2014 Cath Mid RCA lesion, 90% stenosed. Mid LAD lesion, 40% stenosed. The left ventricular systolic function is normal. Prox RCA lesion, 60% stenosed. There is a 0% residual stenosis post intervention. The lesion was previously treated with a stent (unknown type) . A drug-eluting stent was placed.   1. Severe single vessel CAD involving the RCA treated successfully with PTCA and stenting (DES) 2. Mild nonobstructive disease of the LAD 3. Widely patent LCx 4. Preserved overall LV function   Continue DAPT with ASA and Effient  x 12 months as tolerated      Assessment and Plan   1. CAD - s/p NSTEMI 10/2014 with DES to RCA placed - photosensitivyt on ACE I and ARB now off - no symptoms, contniue current meds EKG shows NSR, no ischemic changes   2. Hyperlipidemia -did not tolerate lipitor or crestor, back on  simva at 40mg  daily and most recently zetia  10mg  daily was added -repeta lipid panel.     3. DM2 -poorly controlled - refer to endocrine, does not have pcp. Also given contact for pcp in [place].      Dorn PHEBE Ross, M.D.

## 2023-09-14 ENCOUNTER — Other Ambulatory Visit: Payer: Self-pay | Admitting: Cardiology

## 2023-09-19 DIAGNOSIS — L509 Urticaria, unspecified: Secondary | ICD-10-CM | POA: Diagnosis not present

## 2023-09-19 DIAGNOSIS — B880 Other acariasis: Secondary | ICD-10-CM | POA: Diagnosis not present

## 2023-09-22 ENCOUNTER — Ambulatory Visit (INDEPENDENT_AMBULATORY_CARE_PROVIDER_SITE_OTHER)

## 2023-09-22 ENCOUNTER — Ambulatory Visit: Attending: Cardiology

## 2023-09-22 DIAGNOSIS — R011 Cardiac murmur, unspecified: Secondary | ICD-10-CM

## 2023-09-22 DIAGNOSIS — R0989 Other specified symptoms and signs involving the circulatory and respiratory systems: Secondary | ICD-10-CM | POA: Diagnosis not present

## 2023-09-22 LAB — ECHOCARDIOGRAM COMPLETE
AR max vel: 1.83 cm2
AV Area VTI: 1.96 cm2
AV Area mean vel: 1.81 cm2
AV Mean grad: 14 mmHg
AV Peak grad: 28.9 mmHg
Ao pk vel: 2.69 m/s
Area-P 1/2: 2.99 cm2
Calc EF: 65.3 %
MV VTI: 2.78 cm2
S' Lateral: 3.4 cm
Single Plane A2C EF: 68.3 %
Single Plane A4C EF: 64.4 %

## 2023-09-29 ENCOUNTER — Ambulatory Visit: Payer: Self-pay | Admitting: Cardiology

## 2023-09-29 NOTE — Telephone Encounter (Signed)
-----   Message from Alvan Carrier sent at 09/29/2023  1:01 PM EDT ----- Carotid US  shows mild plaque in the arteries of the neck, just something to monitor at this time  JINNY Alvan MD ----- Message ----- From: Interface, [employer] Sent: 09/22/2023  12:08 PM EDT To: Carrier JULIANNA Alvan, MD

## 2023-09-29 NOTE — Telephone Encounter (Signed)
 Notified, copy to pcp.

## 2023-10-27 ENCOUNTER — Ambulatory Visit: Admitting: "Endocrinology

## 2023-10-27 ENCOUNTER — Encounter: Payer: Self-pay | Admitting: "Endocrinology

## 2023-10-27 VITALS — BP 114/66 | HR 62 | Ht 69.0 in | Wt 196.2 lb

## 2023-10-27 DIAGNOSIS — Z7984 Long term (current) use of oral hypoglycemic drugs: Secondary | ICD-10-CM

## 2023-10-27 DIAGNOSIS — E782 Mixed hyperlipidemia: Secondary | ICD-10-CM

## 2023-10-27 DIAGNOSIS — E1159 Type 2 diabetes mellitus with other circulatory complications: Secondary | ICD-10-CM | POA: Insufficient documentation

## 2023-10-27 DIAGNOSIS — I10 Essential (primary) hypertension: Secondary | ICD-10-CM | POA: Diagnosis not present

## 2023-10-27 LAB — POCT GLYCOSYLATED HEMOGLOBIN (HGB A1C): HbA1c, POC (controlled diabetic range): 11.1 % — AB (ref 0.0–7.0)

## 2023-10-27 MED ORDER — FREESTYLE LIBRE 2 PLUS SENSOR MISC
3 refills | Status: AC
Start: 2023-10-27 — End: ?

## 2023-10-27 MED ORDER — EMPAGLIFLOZIN 10 MG PO TABS: 10.0000 mg | ORAL_TABLET | Freq: Every day | ORAL | 1 refills | Status: DC

## 2023-10-27 MED ORDER — METFORMIN HCL 500 MG PO TABS: 500.0000 mg | ORAL_TABLET | Freq: Two times a day (BID) | ORAL | 1 refills | Status: AC

## 2023-10-27 MED ORDER — FREESTYLE LIBRE 2 READER DEVI: 0 refills | Status: AC

## 2023-10-27 NOTE — Progress Notes (Signed)
 Endocrinology Consult Note       10/27/2023, 10:49 AM   Subjective:    Patient ID: Dennis Terry, male    DOB: 04-21-1965.  Dennis Terry is being seen in consultation for management of currently uncontrolled symptomatic diabetes requested by  Joesph Annabella HERO, FNP.   Past Medical History:  Diagnosis Date   CAD (coronary artery disease), native coronary artery    Diabetes mellitus, type II (HCC)    Hyperlipidemia    Hypertension    Myocardial infarction (HCC) 12/29/2008   Pneumonia 02/04/2014   not sure if it was pneumonia or flu    Past Surgical History:  Procedure Laterality Date   APPENDECTOMY  ~ 2008   CARDIAC CATHETERIZATION  2011   CARDIAC CATHETERIZATION N/A 10/04/2014   Procedure: Left Heart Cath and Coronary Angiography;  Surgeon: Ozell Fell, MD;  Location: [facility] INVASIVE CV LAB;  Service: Cardiovascular;  Laterality: N/A;   CARDIAC CATHETERIZATION N/A 10/04/2014   Procedure: Coronary Stent Intervention;  Surgeon: Ozell Fell, MD;  Location: [facility] INVASIVE CV LAB;  Service: Cardiovascular;  Laterality: N/A;   CORONARY ANGIOPLASTY WITH STENT PLACEMENT  2010; 10/04/2014   FINGER FRACTURE SURGERY Left ~ 2010   middle finger   FOREARM FRACTURE SURGERY Left 1997   FRACTURE SURGERY     WRIST FRACTURE SURGERY Left 1997   w/thumb fracture    Social History   Socioeconomic History   Marital status: Married    Spouse name: Niels   Number of children: 0   Years of education: 12   Highest education level: High school graduate  Occupational History   Not on file  Tobacco Use   Smoking status: Former    Current packs/day: 0.00    Average packs/day: 1.5 packs/day for 30.0 years (45.0 ttl pk-yrs)    Types: Cigarettes    Start date: 12/30/1978    Quit date: 12/29/2008    Years since quitting: 14.8   Smokeless tobacco: Never  Vaping Use   Vaping status: Never Used  Substance and Sexual  Activity   Alcohol use: No    Alcohol/week: 0.0 standard drinks of alcohol    Comment: 10/04/2014 none in the 2000's   Drug use: No   Sexual activity: Yes    Comment: married 2017  year, been together since2001  Other Topics Concern   Not on file  Social History Narrative   Not on file   Social Drivers of Health   Financial Resource Strain: Not on file  Food Insecurity: Not on file  Transportation Needs: Not on file  Physical Activity: Not on file  Stress: Not on file  Social Connections: Not on file    Family History  Problem Relation Age of Onset   Heart failure Father    Cancer Other    Cancer Mother        pancreatic   Colon cancer Neg Hx     Outpatient Encounter Medications as of 10/27/2023  Medication Sig   Continuous Glucose Receiver (FREESTYLE LIBRE 2 READER) DEVI As directed   Continuous Glucose Sensor (FREESTYLE LIBRE 2 PLUS SENSOR) MISC Change sensor every 15 days.  empagliflozin (JARDIANCE) 10 MG TABS tablet Take 1 tablet (10 mg total) by mouth daily before breakfast.   metFORMIN (GLUCOPHAGE) 500 MG tablet Take 1 tablet (500 mg total) by mouth 2 (two) times daily with a meal.   aspirin  81 MG chewable tablet Chew 1 tablet (81 mg total) by mouth daily.   ezetimibe  (ZETIA ) 10 MG tablet Take 1 tablet by mouth once daily   metoprolol  tartrate (LOPRESSOR ) 25 MG tablet Take 1 tablet by mouth twice daily   nitroGLYCERIN  (NITROSTAT ) 0.4 MG SL tablet Place 1 tablet (0.4 mg total) under the tongue every 5 (five) minutes x 3 doses as needed for chest pain (if no relief after 3rd dose, proceed to ED or call 911).   simvastatin  (ZOCOR ) 40 MG tablet TAKE 1 TABLET BY MOUTH ONCE DAILY IN THE EVENING   No facility-administered encounter medications on file as of 10/27/2023.    ALLERGIES: Allergies  Allergen Reactions   Oxycodone-Acetaminophen  Shortness Of Breath and Other (See Comments)    Chest tightness and numbness    Oxycodone-Acetaminophen  Other (See Comments) and  Shortness Of Breath    Chest tightness and numbness  Other Reaction(s): Other (See Comments)  Chest tightness and numbness     Chest tightness and numbness   Lipitor [Atorvastatin ] Other (See Comments)    Muscle aches     VACCINATION STATUS: Immunization History  Administered Date(s) Administered   Tdap 06/30/2017    Diabetes He presents for his initial diabetic visit. He has type 2 diabetes mellitus. His disease course has been worsening. There are no hypoglycemic associated symptoms. Pertinent negatives for hypoglycemia include no confusion, headaches, pallor or seizures. Associated symptoms include blurred vision, polydipsia and polyuria. Pertinent negatives for diabetes include no chest pain, no fatigue, no polyphagia and no weakness. There are no hypoglycemic complications. Symptoms are worsening. Diabetic complications include heart disease. Risk factors for coronary artery disease include diabetes mellitus, dyslipidemia, hypertension, male sex and tobacco exposure. When asked about current treatments, none were reported. His weight is fluctuating minimally. He is following a generally unhealthy diet. When asked about meal planning, he reported none. He has not had a previous visit with a dietitian. He rarely participates in exercise. His overall blood glucose range is >200 mg/dl. (Patient has point-of-care A1c of 11.1%.  He is not monitoring blood glucose on a regular basis, above 200 when he checks them randomly.  He did not bring any meter.  He is not on any medications.) An ACE inhibitor/angiotensin II receptor blocker is not being taken. Eye exam is not current.  Hyperlipidemia This is a chronic problem. The problem is uncontrolled. Exacerbating diseases include diabetes. Pertinent negatives include no chest pain, myalgias or shortness of breath. Current antihyperlipidemic treatment includes statins and ezetimibe . Risk factors for coronary artery disease include dyslipidemia, diabetes  mellitus, male sex, hypertension and a sedentary lifestyle.  Hypertension This is a chronic problem. The problem is controlled. Associated symptoms include blurred vision. Pertinent negatives include no chest pain, headaches, neck pain, palpitations or shortness of breath. Risk factors for coronary artery disease include dyslipidemia, diabetes mellitus, male gender, sedentary lifestyle and smoking/tobacco exposure. Past treatments include beta blockers. Hypertensive end-organ damage includes CAD/MI.     Review of Systems  Constitutional:  Negative for chills, fatigue, fever and unexpected weight change.  HENT:  Negative for dental problem, mouth sores and trouble swallowing.   Eyes:  Positive for blurred vision. Negative for visual disturbance.  Respiratory:  Negative for cough, choking, chest tightness,  shortness of breath and wheezing.   Cardiovascular:  Negative for chest pain, palpitations and leg swelling.  Gastrointestinal:  Negative for abdominal distention, abdominal pain, constipation, diarrhea, nausea and vomiting.  Endocrine: Positive for polydipsia and polyuria. Negative for polyphagia.  Genitourinary:  Negative for dysuria, flank pain, hematuria and urgency.  Musculoskeletal:  Negative for back pain, gait problem, myalgias and neck pain.  Skin:  Negative for pallor, rash and wound.  Neurological:  Negative for seizures, syncope, weakness, numbness and headaches.  Psychiatric/Behavioral:  Negative for confusion and dysphoric mood.     Objective:       10/27/2023    9:04 AM 08/26/2023   12:18 PM 08/26/2023   11:33 AM  Vitals with BMI  Height 5' 9  5' 9  Weight 196 lbs 3 oz    BMI 28.96    Systolic 114 118 855  Diastolic 66 70 90  Pulse 62  60    BP 114/66   Pulse 62   Ht 5' 9 (1.753 m)   Wt 196 lb 3.2 oz (89 kg)   BMI 28.97 kg/m   Wt Readings from Last 3 Encounters:  10/27/23 196 lb 3.2 oz (89 kg)  11/05/22 201 lb (91.2 kg)  05/17/22 204 lb 12.8 oz (92.9 kg)      Physical Exam Constitutional:      General: He is not in acute distress.    Appearance: He is well-developed.  HENT:     Head: Normocephalic and atraumatic.  Neck:     Thyroid: No thyromegaly.     Trachea: No tracheal deviation.  Cardiovascular:     Rate and Rhythm: Normal rate.     Pulses:          Dorsalis pedis pulses are 1+ on the right side and 1+ on the left side.       Posterior tibial pulses are 1+ on the right side and 1+ on the left side.     Heart sounds: Normal heart sounds, S1 normal and S2 normal. No murmur heard.    No gallop.  Pulmonary:     Effort: No respiratory distress.     Breath sounds: Normal breath sounds. No wheezing.  Abdominal:     General: Bowel sounds are normal. There is no distension.     Palpations: Abdomen is soft.     Tenderness: There is no abdominal tenderness. There is no guarding.  Musculoskeletal:     Right shoulder: No swelling or deformity.     Cervical back: Normal range of motion and neck supple.  Skin:    General: Skin is warm and dry.     Findings: No rash.     Nails: There is no clubbing.  Neurological:     Mental Status: He is alert and oriented to person, place, and time.     Cranial Nerves: No cranial nerve deficit.     Sensory: No sensory deficit.     Gait: Gait normal.     Deep Tendon Reflexes: Reflexes are normal and symmetric.  Psychiatric:        Speech: Speech normal.        Behavior: Behavior normal. Behavior is cooperative.        Thought Content: Thought content normal.        Judgment: Judgment normal.     Comments: Reluctant affect.       CMP ( most recent) CMP     Component Value Date/Time   NA 137 05/20/2022 0848  K 4.1 05/20/2022 0848   CL 103 05/20/2022 0848   CO2 22 05/20/2022 0848   GLUCOSE 279 (H) 05/20/2022 0848   GLUCOSE 174 (H) 08/03/2015 1533   BUN 10 05/20/2022 0848   CREATININE 0.83 05/20/2022 0848   CALCIUM  9.0 05/20/2022 0848   PROT 6.7 05/20/2022 0848   ALBUMIN 4.2  05/20/2022 0848   AST 27 05/20/2022 0848   ALT 29 05/20/2022 0848   ALKPHOS 98 05/20/2022 0848   BILITOT 0.6 05/20/2022 0848   EGFR 101 05/20/2022 0848   GFRNONAA 99 07/14/2017 0823     Diabetic Labs (most recent): Lab Results  Component Value Date   HGBA1C 11.1 (A) 10/27/2023   HGBA1C 11.7 (H) 05/20/2022   HGBA1C 9.0 (H) 04/23/2021     Lipid Panel ( most recent) Lipid Panel     Component Value Date/Time   CHOL 135 05/20/2022 0848   TRIG 191 (H) 05/20/2022 0848   HDL 35 (L) 05/20/2022 0848   CHOLHDL 3.9 05/20/2022 0848   CHOLHDL 6.1 01/09/2010 0156   VLDL 39 01/09/2010 0156   LDLCALC 68 05/20/2022 0848   LDLDIRECT 66.7 07/29/2009 0828   LABVLDL 32 05/20/2022 0848          Assessment & Plan:   1. DM type 2 causing vascular disease (HCC) (Primary)   - Damiel Barthold has currently uncontrolled symptomatic type 2 DM since 58 years of age,  with most recent A1c of 11.1% at point-of-care today.  Recent labs reviewed. - I had a long discussion with him about the possible risk factors and  the pathology behind diabetes and its complications. -his diabetes is complicated by coronary artery disease, sedentary life, comorbid hypertension/hyperlipidemia and he remains at exceedingly  high risk for more acute and chronic complications which include CAD, CVA, CKD, retinopathy, and neuropathy. These are all discussed in detail with him.  - I discussed all available options of managing his diabetes including de-escalation of medications. I have counseled him on Food as Medicine by adopting a Whole Food , Plant Predominant  ( WFPP) nutrition as recommended by [employer] of Lifestyle Medicine. Patient is encouraged to switch to  unprocessed or minimally processed  complex starch, adequate protein intake (mainly plant source), minimal liquid fat, plenty of fruits, and vegetables. -  he is advised to stick to a routine mealtimes to eat 3 complete meals a day and snack only when  necessary (to snack only to correct hypoglycemia BG <70 day time or <100 at night).   - he acknowledges that there is a room for improvement in his food and drink choices. - Further Specific Suggestion is made for him to avoid simple carbohydrates  from his diet including Cakes, Sweet Desserts, Ice Cream, Soda (diet and regular), Sweet Tea, Candies, Chips, Cookies, Store Bought Juices, Alcohol ,  Artificial Sweeteners,  Coffee Creamer, and Sugar-free Products. This will help patient to have more stable blood glucose profile and potentially avoid unintended weight gain.   - he will be scheduled with Penny Crumpton, RDN, CDE for individualized diabetes education.  - I have approached him with the following individualized plan to manage  his diabetes and patient agrees:   - This patient is not on any medication management.  He would like to take it very slowly.  He generally has huge dislike for medications. - with great hesitance, he accepts my recommendation to start with oral medications including metformin 500 mg  twice daily and Jardiance 10 mg p.o. daily at  breakfast.   - He will be given a CGM to monitor blood glucose and return in 2 to 3 weeks with his results to review.  If his insurance does not provide coverage for his CGM, he is willing to monitor blood glucose AC and HS. - he is encouraged to call clinic for blood glucose levels less than 70 or above 200 mg /dl. - If he returns with significant hyperglycemia, he will be considered for either insulin treatment or GLP-1 receptor agonist next visit. - He will be considered for full package of lifestyle medicine subsequent visits.    - Specific targets for  A1c;  LDL, HDL,  and Triglycerides were discussed with the patient.  2) Blood Pressure /Hypertension:  his blood pressure is  controlled to target.   he is advised to continue his current medications including metoprolol  25 mg p.o. twice daily, he will be considered for ACE inhibitors  or ARB next visit. 3) Lipids/Hyperlipidemia:   Review of his recent lipid panel showed uncontrolled triglycerides at 191 and LDL at 68.   He is advised to continue simvastatin  40 mg p.o. nightly and ezetimibe  10 mg p.o. daily at bedtime  Side effects and precautions discussed with him.  4)  Weight/Diet:  Body mass index is 28.97 kg/m.  -     he is  a candidate for some weight loss. I discussed with him the fact that loss of 5 - 10% of his  current body weight will have the most impact on his diabetes management.  The above detailed  ACLM recommendations for nutrition, exercise, sleep, social life, avoidance of risky substances, the need for restorative sleep  information is also detailed on discharge instructions.  5) Chronic Care/Health Maintenance:  -he  is on Statin medications and  is encouraged to initiate and continue to follow up with Ophthalmology, Dentist,  Podiatrist at least yearly or according to recommendations, and advised to   stay away from smoking. I have recommended yearly flu vaccine and pneumonia vaccine at least every 5 years; moderate intensity exercise for up to 150 minutes weekly; and  sleep for 7- 9 hours a day.  - he is  advised to maintain close follow up with Joesph Annabella HERO, FNP for primary care needs, as well as his other providers for optimal and coordinated care.   Thank you for involving me in the care of this pleasant patient.  I spent  63  minutes in the care of the patient today including review of labs from CMP, Lipids, Thyroid Function, Hematology (current and previous including abstractions from other facilities); face-to-face time discussing  his blood glucose readings/logs, discussing hypoglycemia and hyperglycemia episodes and symptoms, medications doses, his options of short and long term treatment based on the latest standards of care / guidelines;  discussion about incorporating lifestyle medicine;  and documenting the encounter. Risk reduction counseling  performed per USPSTF guidelines to reduce cardiovascular risk factors.      Please refer to Patient Instructions for Blood Glucose Monitoring and Insulin/Medications Dosing Guide  in media tab for additional information. Please  also refer to  Patient Self Inventory in the Media  tab for reviewed elements of pertinent patient history.  Debby Sharps participated in the discussions, expressed understanding, and voiced agreement with the above plans.  All questions were answered to his satisfaction. he is encouraged to contact clinic should he have any questions or concerns prior to his return visit.   Follow up plan: - Return in  about 3 weeks (around 11/17/2023) for F/U with Pre-visit Labs, Meter/CGM/Logs, A1c here.  Ranny Earl, MD [facility] [address] [place], [place] [address] Phone: [phone]  Fax: [phone]    10/27/2023, 10:49 AM  This note was partially dictated with voice recognition software. Similar sounding words can be transcribed inadequately or may not  be corrected upon review.

## 2023-10-27 NOTE — Patient Instructions (Signed)

## 2023-10-31 ENCOUNTER — Other Ambulatory Visit

## 2023-10-31 DIAGNOSIS — E1159 Type 2 diabetes mellitus with other circulatory complications: Secondary | ICD-10-CM | POA: Diagnosis not present

## 2023-11-08 LAB — COMPREHENSIVE METABOLIC PANEL WITH GFR
ALT: 21 IU/L (ref 0–44)
AST: 17 IU/L (ref 0–40)
Albumin: 4.2 g/dL (ref 3.8–4.9)
Alkaline Phosphatase: 88 IU/L (ref 47–123)
BUN/Creatinine Ratio: 19 (ref 9–20)
BUN: 14 mg/dL (ref 6–24)
Bilirubin Total: 0.7 mg/dL (ref 0.0–1.2)
CO2: 24 mmol/L (ref 20–29)
Calcium: 8.9 mg/dL (ref 8.7–10.2)
Chloride: 103 mmol/L (ref 96–106)
Creatinine, Ser: 0.74 mg/dL — ABNORMAL LOW (ref 0.76–1.27)
Globulin, Total: 2.5 g/dL (ref 1.5–4.5)
Glucose: 261 mg/dL — ABNORMAL HIGH (ref 70–99)
Potassium: 4 mmol/L (ref 3.5–5.2)
Sodium: 139 mmol/L (ref 134–144)
Total Protein: 6.7 g/dL (ref 6.0–8.5)
eGFR: 104 mL/min/1.73 (ref >59)

## 2023-11-08 LAB — LIPID PANEL
Chol/HDL Ratio: 3.6 ratio (ref 0.0–5.0)
Cholesterol, Total: 140 mg/dL (ref 100–199)
HDL: 39 mg/dL — ABNORMAL LOW (ref >39)
LDL Chol Calc (NIH): 72 mg/dL (ref 0–99)
Triglycerides: 167 mg/dL — ABNORMAL HIGH (ref 0–149)
VLDL Cholesterol Cal: 29 mg/dL (ref 5–40)

## 2023-11-08 LAB — TSH: TSH: 1.49 u[IU]/mL (ref 0.450–4.500)

## 2023-11-08 LAB — FIB-4 W/REFLEX TO ELF
FIB-4 Index: 1.43 (ref 0.00–2.67)
Platelets: 153 x10E3/uL (ref 150–450)

## 2023-11-08 LAB — ENHANCED LIVER FIBROSIS (ELF)

## 2023-11-08 LAB — T4, FREE: Free T4: 1.01 ng/dL (ref 0.82–1.77)

## 2023-11-10 ENCOUNTER — Ambulatory Visit: Admitting: "Endocrinology

## 2023-11-10 ENCOUNTER — Encounter: Payer: Self-pay | Admitting: "Endocrinology

## 2023-11-10 VITALS — BP 106/72 | HR 68 | Ht 69.0 in | Wt 193.6 lb

## 2023-11-10 DIAGNOSIS — E785 Hyperlipidemia, unspecified: Secondary | ICD-10-CM

## 2023-11-10 DIAGNOSIS — I10 Essential (primary) hypertension: Secondary | ICD-10-CM | POA: Diagnosis not present

## 2023-11-10 DIAGNOSIS — E782 Mixed hyperlipidemia: Secondary | ICD-10-CM

## 2023-11-10 DIAGNOSIS — E1159 Type 2 diabetes mellitus with other circulatory complications: Secondary | ICD-10-CM | POA: Diagnosis not present

## 2023-11-10 DIAGNOSIS — Z7984 Long term (current) use of oral hypoglycemic drugs: Secondary | ICD-10-CM

## 2023-11-10 MED ORDER — EMPAGLIFLOZIN 25 MG PO TABS: 25.0000 mg | ORAL_TABLET | Freq: Every day | ORAL | 1 refills | Status: AC

## 2023-11-10 NOTE — Patient Instructions (Signed)

## 2023-11-10 NOTE — Progress Notes (Signed)
 Endocrinology Consult Note       11/10/2023, 10:50 AM   Subjective:    Patient ID: Dennis Terry, male    DOB: [birth year].  Dennis Terry is being seen in consultation for management of currently uncontrolled symptomatic diabetes requested by  Joesph Annabella HERO, FNP.   Past Medical History:  Diagnosis Date   CAD (coronary artery disease), native coronary artery    Diabetes mellitus, type II (HCC)    Hyperlipidemia    Hypertension    Myocardial infarction (HCC) 12/29/2008   Pneumonia 02/04/2014   not sure if it was pneumonia or flu    Past Surgical History:  Procedure Laterality Date   APPENDECTOMY  ~ 2008   CARDIAC CATHETERIZATION  2011   CARDIAC CATHETERIZATION N/A 10/04/2014   Procedure: Left Heart Cath and Coronary Angiography;  Surgeon: Ozell Fell, MD;  Location: [facility] INVASIVE CV LAB;  Service: Cardiovascular;  Laterality: N/A;   CARDIAC CATHETERIZATION N/A 10/04/2014   Procedure: Coronary Stent Intervention;  Surgeon: Ozell Fell, MD;  Location: [facility] INVASIVE CV LAB;  Service: Cardiovascular;  Laterality: N/A;   CORONARY ANGIOPLASTY WITH STENT PLACEMENT  2010; 10/04/2014   FINGER FRACTURE SURGERY Left ~ 2010   middle finger   FOREARM FRACTURE SURGERY Left 1997   FRACTURE SURGERY     WRIST FRACTURE SURGERY Left 1997   w/thumb fracture    Social History   Socioeconomic History   Marital status: Married    Spouse name: Niels   Number of children: 0   Years of education: 12   Highest education level: High school graduate  Occupational History   Not on file  Tobacco Use   Smoking status: Former    Current packs/day: 0.00    Average packs/day: 1.5 packs/day for 30.0 years (45.0 ttl pk-yrs)    Types: Cigarettes    Start date: 12/30/1978    Quit date: 12/29/2008    Years since quitting: 14.8   Smokeless tobacco: Never  Vaping Use   Vaping status: Never Used  Substance and Sexual  Activity   Alcohol use: No    Alcohol/week: 0.0 standard drinks of alcohol    Comment: 10/04/2014 none in the 2000's   Drug use: No   Sexual activity: Yes    Comment: married 2017  year, been together since2001  Other Topics Concern   Not on file  Social History Narrative   Not on file   Social Drivers of Health   Financial Resource Strain: Not on file  Food Insecurity: Not on file  Transportation Needs: Not on file  Physical Activity: Not on file  Stress: Not on file  Social Connections: Not on file    Family History  Problem Relation Age of Onset   Heart failure Father    Cancer Other    Cancer Mother        pancreatic   Colon cancer Neg Hx     Outpatient Encounter Medications as of 11/10/2023  Medication Sig   aspirin  81 MG chewable tablet Chew 1 tablet (81 mg total) by mouth daily.   Continuous Glucose Receiver (FREESTYLE LIBRE 2 READER) DEVI As directed (  Patient not taking: Reported on 11/10/2023)   Continuous Glucose Sensor (FREESTYLE LIBRE 2 PLUS SENSOR) MISC Change sensor every 15 days. (Patient not taking: Reported on 11/10/2023)   empagliflozin (JARDIANCE) 25 MG TABS tablet Take 1 tablet (25 mg total) by mouth daily before breakfast.   ezetimibe  (ZETIA ) 10 MG tablet Take 1 tablet by mouth once daily   metFORMIN (GLUCOPHAGE) 500 MG tablet Take 1 tablet (500 mg total) by mouth 2 (two) times daily with a meal.   metoprolol  tartrate (LOPRESSOR ) 25 MG tablet Take 1 tablet by mouth twice daily   nitroGLYCERIN  (NITROSTAT ) 0.4 MG SL tablet Place 1 tablet (0.4 mg total) under the tongue every 5 (five) minutes x 3 doses as needed for chest pain (if no relief after 3rd dose, proceed to ED or call 911).   simvastatin  (ZOCOR ) 40 MG tablet TAKE 1 TABLET BY MOUTH ONCE DAILY IN THE EVENING   [DISCONTINUED] empagliflozin (JARDIANCE) 10 MG TABS tablet Take 1 tablet (10 mg total) by mouth daily before breakfast.   No facility-administered encounter medications on file as of  11/10/2023.    ALLERGIES: Allergies  Allergen Reactions   Oxycodone-Acetaminophen  Shortness Of Breath and Other (See Comments)    Chest tightness and numbness    Oxycodone-Acetaminophen  Other (See Comments) and Shortness Of Breath    Chest tightness and numbness  Other Reaction(s): Other (See Comments)  Chest tightness and numbness     Chest tightness and numbness   Lipitor [Atorvastatin ] Other (See Comments)    Muscle aches     VACCINATION STATUS: Immunization History  Administered Date(s) Administered   Tdap 06/30/2017    Diabetes He presents for his follow-up diabetic visit. He has type 2 diabetes mellitus. His disease course has been worsening. There are no hypoglycemic associated symptoms. Pertinent negatives for hypoglycemia include no confusion, headaches, pallor or seizures. Associated symptoms include blurred vision, polydipsia and polyuria. Pertinent negatives for diabetes include no chest pain, no fatigue, no polyphagia and no weakness. There are no hypoglycemic complications. Symptoms are worsening. Diabetic complications include heart disease. Risk factors for coronary artery disease include diabetes mellitus, dyslipidemia, hypertension, male sex and tobacco exposure. When asked about current treatments, none were reported. His weight is fluctuating minimally. He is following a generally unhealthy diet. When asked about meal planning, he reported none. He has not had a previous visit with a dietitian. He rarely participates in exercise. His overall blood glucose range is 130-140 mg/dl. (Patient had recent point-of-care A1c of 11.1%.  He presents with no meter nor logs.  His insurance did not cover CGM device.  He reports that his blood glucose readings are between 100 to 130 mg per DL.  ) An ACE inhibitor/angiotensin II receptor blocker is not being taken. Eye exam is not current.  Hyperlipidemia This is a chronic problem. The problem is uncontrolled. Exacerbating diseases  include diabetes. Pertinent negatives include no chest pain, myalgias or shortness of breath. Current antihyperlipidemic treatment includes statins and ezetimibe . Risk factors for coronary artery disease include dyslipidemia, diabetes mellitus, male sex, hypertension and a sedentary lifestyle.  Hypertension This is a chronic problem. The problem is controlled. Associated symptoms include blurred vision. Pertinent negatives include no chest pain, headaches, neck pain, palpitations or shortness of breath. Risk factors for coronary artery disease include dyslipidemia, diabetes mellitus, male gender, sedentary lifestyle and smoking/tobacco exposure. Past treatments include beta blockers. Hypertensive end-organ damage includes CAD/MI.      Objective:       11/10/2023  10:09 AM 10/27/2023    9:04 AM 08/26/2023   12:18 PM  Vitals with BMI  Height 5' 9 5' 9   Weight 193 lbs 10 oz 196 lbs 3 oz   BMI 28.58 28.96   Systolic 106 114 881  Diastolic 72 66 70  Pulse 68 62     BP 106/72   Pulse 68   Ht 5' 9 (1.753 m)   Wt 193 lb 9.6 oz (87.8 kg)   BMI 28.59 kg/m   Wt Readings from Last 3 Encounters:  11/10/23 193 lb 9.6 oz (87.8 kg)  10/27/23 196 lb 3.2 oz (89 kg)  11/05/22 201 lb (91.2 kg)      CMP ( most recent) CMP     Component Value Date/Time   NA 139 10/31/2023 0958   K 4.0 10/31/2023 0958   CL 103 10/31/2023 0958   CO2 24 10/31/2023 0958   GLUCOSE 261 (H) 10/31/2023 0958   GLUCOSE 174 (H) 08/03/2015 1533   BUN 14 10/31/2023 0958   CREATININE 0.74 (L) 10/31/2023 0958   CALCIUM  8.9 10/31/2023 0958   PROT 6.7 10/31/2023 0958   ALBUMIN 4.2 10/31/2023 0958   AST 17 10/31/2023 0958   ALT 21 10/31/2023 0958   ALKPHOS 88 10/31/2023 0958   BILITOT 0.7 10/31/2023 0958   EGFR 104 10/31/2023 0958   GFRNONAA 99 07/14/2017 0823     Diabetic Labs (most recent): Lab Results  Component Value Date   HGBA1C 11.1 (A) 10/27/2023   HGBA1C 11.7 (H) 05/20/2022   HGBA1C 9.0 (H)  04/23/2021     Lipid Panel ( most recent) Lipid Panel     Component Value Date/Time   CHOL 140 10/31/2023 0958   TRIG 167 (H) 10/31/2023 0958   HDL 39 (L) 10/31/2023 0958   CHOLHDL 3.6 10/31/2023 0958   CHOLHDL 6.1 01/09/2010 0156   VLDL 39 01/09/2010 0156   LDLCALC 72 10/31/2023 0958   LDLDIRECT 66.7 07/29/2009 0828   LABVLDL 29 10/31/2023 0958        Assessment & Plan:   1. DM type 2 causing vascular disease (HCC) (Primary)   - Dennis Terry has currently uncontrolled symptomatic type 2 DM since 58 years of age. Patient had recent point-of-care A1c of 11.1%.  He presents with no meter nor logs.  His insurance did not cover CGM device.  He reports that his blood glucose readings are between 100 to 130 mg per DL.   - Recent labs reviewed. - I had a long discussion with him about the possible risk factors and  the pathology behind diabetes and its complications. -his diabetes is complicated by coronary artery disease, sedentary life, comorbid hypertension/hyperlipidemia and he remains at exceedingly  high risk for more acute and chronic complications which include CAD, CVA, CKD, retinopathy, and neuropathy. These are all discussed in detail with him.  - I discussed all available options of managing his diabetes including de-escalation of medications. I have counseled him on Food as Medicine by adopting a Whole Food , Plant Predominant  ( WFPP) nutrition as recommended by [employer] of Lifestyle Medicine. Patient is encouraged to switch to  unprocessed or minimally processed  complex starch, adequate protein intake (mainly plant source), minimal liquid fat, plenty of fruits, and vegetables. -  he is advised to stick to a routine mealtimes to eat 3 complete meals a day and snack only when necessary (to snack only to correct hypoglycemia BG <70 day time or <100 at night).   -  he acknowledges that there is a room for improvement in his food and drink choices. - Further Specific  Suggestion is made for him to avoid simple carbohydrates  from his diet including Cakes, Sweet Desserts, Ice Cream, Soda (diet and regular), Sweet Tea, Candies, Chips, Cookies, Store Bought Juices, Alcohol ,  Artificial Sweeteners,  Coffee Creamer, and Sugar-free Products. This will help patient to have more stable blood glucose profile and potentially avoid unintended weight gain.   - he will be scheduled with Penny Crumpton, RDN, CDE for individualized diabetes education.  - I have approached him with the following individualized plan to manage  his diabetes and patient agrees:   - he did not bring any logs nor meter with him.  He generally has huge dislike for medications.  He was started on metformin and Jardiance last visit. -He does not report any side effects from this medications. -I advised him to continue metformin 500 g p.o. twice daily and discussed and increase his Jardiance to 25 mg p.o. daily at breakfast.    -He is encouraged to monitor blood glucose twice a day-daily before breakfast and at bedtime. - he is encouraged to call clinic for blood glucose levels less than 70 or above 200 mg /dl. - If he returns with significant hyperglycemia, he will be considered for either insulin treatment or GLP-1 receptor agonist next visit. - He will be considered for full package of lifestyle medicine subsequent visits.    - Specific targets for  A1c;  LDL, HDL,  and Triglycerides were discussed with the patient.  2) Blood Pressure /Hypertension:    His blood pressure is controlled to target.  he is advised to continue his current medications including metoprolol  25 mg p.o. twice daily, he will be considered for ACE inhibitors or ARB next visit.  3) Lipids/Hyperlipidemia:   Review of his recent lipid panel showed uncontrolled triglycerides at 191 and LDL at 68.   He is advised to continue simvastatin  40 mg p.o. nightly and ezetimibe  10 mg p.o. daily at bedtime  Side effects and  precautions discussed with him.  4)  Weight/Diet:  Body mass index is 28.59 kg/m.  -     he is  a candidate for some weight loss. I discussed with him the fact that loss of 5 - 10% of his  current body weight will have the most impact on his diabetes management.  The above detailed  ACLM recommendations for nutrition, exercise, sleep, social life, avoidance of risky substances, the need for restorative sleep  information is also detailed on discharge instructions.  5) Chronic Care/Health Maintenance:  -he  is on Statin medications and  is encouraged to initiate and continue to follow up with Ophthalmology, Dentist,  Podiatrist at least yearly or according to recommendations, and advised to   stay away from smoking. I have recommended yearly flu vaccine and pneumonia vaccine at least every 5 years; moderate intensity exercise for up to 150 minutes weekly; and  sleep for 7- 9 hours a day.  - he is  advised to maintain close follow up with Joesph Annabella HERO, FNP for primary care needs, as well as his other providers for optimal and coordinated care.  I spent  26  minutes in the care of the patient today including review of labs from CMP, Lipids, Thyroid Function, Hematology (current and previous including abstractions from other facilities); face-to-face time discussing  his blood glucose readings/logs, discussing hypoglycemia and hyperglycemia episodes and symptoms, medications doses, his  options of short and long term treatment based on the latest standards of care / guidelines;  discussion about incorporating lifestyle medicine;  and documenting the encounter. Risk reduction counseling performed per USPSTF guidelines to reduce  obesity and cardiovascular risk factors.     Please refer to Patient Instructions for Blood Glucose Monitoring and Insulin/Medications Dosing Guide  in media tab for additional information. Please  also refer to  Patient Self Inventory in the Media  tab for reviewed elements  of pertinent patient history.  Debby Sharps participated in the discussions, expressed understanding, and voiced agreement with the above plans.  All questions were answered to his satisfaction. he is encouraged to contact clinic should he have any questions or concerns prior to his return visit.   Follow up plan: - Return in about 10 weeks (around 01/19/2024) for Bring Meter/CGM Device/Logs- A1c in Office.  Ranny Earl, MD [facility] [address] [place], [place] [address] Phone: [phone]  Fax: [phone]    11/10/2023, 10:50 AM  This note was partially dictated with voice recognition software. Similar sounding words can be transcribed inadequately or may not  be corrected upon review.

## 2024-02-03 ENCOUNTER — Ambulatory Visit: Admitting: "Endocrinology

## 2024-02-03 ENCOUNTER — Encounter: Payer: Self-pay | Admitting: "Endocrinology

## 2024-02-03 VITALS — BP 112/76 | HR 60 | Ht 69.0 in | Wt 203.4 lb

## 2024-02-03 DIAGNOSIS — E782 Mixed hyperlipidemia: Secondary | ICD-10-CM

## 2024-02-03 DIAGNOSIS — E1159 Type 2 diabetes mellitus with other circulatory complications: Secondary | ICD-10-CM | POA: Diagnosis not present

## 2024-02-03 DIAGNOSIS — I10 Essential (primary) hypertension: Secondary | ICD-10-CM | POA: Diagnosis not present

## 2024-02-03 DIAGNOSIS — Z7984 Long term (current) use of oral hypoglycemic drugs: Secondary | ICD-10-CM

## 2024-02-03 LAB — POCT GLYCOSYLATED HEMOGLOBIN (HGB A1C): HbA1c, POC (controlled diabetic range): 7.1 % — AB (ref 0.0–7.0)

## 2024-02-03 NOTE — Progress Notes (Signed)
 "                                                                             Endocrinology follow-up note       02/03/2024, 2:04 PM   Subjective:    Patient ID: Dennis Terry, male    DOB: 1965-07-04.  Dennis Terry is being seen in follow-up after he was seen in consultation for management of currently uncontrolled symptomatic diabetes requested by  Joesph Annabella HERO, FNP.   Past Medical History:  Diagnosis Date   CAD (coronary artery disease), native coronary artery    Diabetes mellitus, type II (HCC)    Hyperlipidemia    Hypertension    Myocardial infarction (HCC) 12/29/2008   Pneumonia 02/04/2014   not sure if it was pneumonia or flu    Past Surgical History:  Procedure Laterality Date   APPENDECTOMY  ~ 2008   CARDIAC CATHETERIZATION  2011   CARDIAC CATHETERIZATION N/A 10/04/2014   Procedure: Left Heart Cath and Coronary Angiography;  Surgeon: Ozell Fell, MD;  Location: [facility] INVASIVE CV LAB;  Service: Cardiovascular;  Laterality: N/A;   CARDIAC CATHETERIZATION N/A 10/04/2014   Procedure: Coronary Stent Intervention;  Surgeon: Ozell Fell, MD;  Location: [facility] INVASIVE CV LAB;  Service: Cardiovascular;  Laterality: N/A;   CORONARY ANGIOPLASTY WITH STENT PLACEMENT  2010; 10/04/2014   FINGER FRACTURE SURGERY Left ~ 2010   middle finger   FOREARM FRACTURE SURGERY Left 1997   FRACTURE SURGERY     WRIST FRACTURE SURGERY Left 1997   w/thumb fracture    Social History   Socioeconomic History   Marital status: Married    Spouse name: Niels   Number of children: 0   Years of education: 12   Highest education level: High school graduate  Occupational History   Not on file  Tobacco Use   Smoking status: Former    Current packs/day: 0.00    Average packs/day: 1.5 packs/day for 30.0 years (45.0 ttl pk-yrs)    Types: Cigarettes    Start date: 12/30/1978    Quit date: 12/29/2008    Years since quitting: 15.1   Smokeless tobacco: Never  Vaping Use   Vaping status:  Never Used  Substance and Sexual Activity   Alcohol use: No    Alcohol/week: 0.0 standard drinks of alcohol    Comment: 10/04/2014 none in the 2000's   Drug use: No   Sexual activity: Yes    Comment: married 2017  year, been together since2001  Other Topics Concern   Not on file  Social History Narrative   Not on file   Social Drivers of Health   Tobacco Use: Medium Risk (02/03/2024)   Patient History    Smoking Tobacco Use: Former    Smokeless Tobacco Use: Never    Passive Exposure: Not on Actuary Strain: Not on file  Food Insecurity: Not on file  Transportation Needs: Not on file  Physical Activity: Not on file  Stress: Not on file  Social Connections: Not on file  Depression (PHQ2-9): Low Risk (04/23/2021)   Depression (PHQ2-9)    PHQ-2 Score: 0  Alcohol Screen: Not on file  Housing: Not on file  Utilities: Not on file  Health Literacy: Not on file    Family History  Problem Relation Age of Onset   Heart failure Father    Cancer Other    Cancer Mother        pancreatic   Colon cancer Neg Hx     Outpatient Encounter Medications as of 02/03/2024  Medication Sig   aspirin  81 MG chewable tablet Chew 1 tablet (81 mg total) by mouth daily.   Continuous Glucose Receiver (FREESTYLE LIBRE 2 READER) DEVI As directed (Patient not taking: Reported on 11/10/2023)   Continuous Glucose Sensor (FREESTYLE LIBRE 2 PLUS SENSOR) MISC Change sensor every 15 days. (Patient not taking: Reported on 11/10/2023)   empagliflozin  (JARDIANCE ) 25 MG TABS tablet Take 1 tablet (25 mg total) by mouth daily before breakfast.   ezetimibe  (ZETIA ) 10 MG tablet Take 1 tablet by mouth once daily   metFORMIN  (GLUCOPHAGE ) 500 MG tablet Take 1 tablet (500 mg total) by mouth 2 (two) times daily with a meal.   metoprolol  tartrate (LOPRESSOR ) 25 MG tablet Take 1 tablet by mouth twice daily   nitroGLYCERIN  (NITROSTAT ) 0.4 MG SL tablet Place 1 tablet (0.4 mg total) under the tongue every 5  (five) minutes x 3 doses as needed for chest pain (if no relief after 3rd dose, proceed to ED or call 911).   simvastatin  (ZOCOR ) 40 MG tablet TAKE 1 TABLET BY MOUTH ONCE DAILY IN THE EVENING   No facility-administered encounter medications on file as of 02/03/2024.    ALLERGIES: Allergies  Allergen Reactions   Oxycodone-Acetaminophen  Shortness Of Breath and Other (See Comments)    Chest tightness and numbness    Oxycodone-Acetaminophen  Other (See Comments) and Shortness Of Breath    Chest tightness and numbness  Other Reaction(s): Other (See Comments)  Chest tightness and numbness     Chest tightness and numbness   Lipitor [Atorvastatin ] Other (See Comments)    Muscle aches     VACCINATION STATUS: Immunization History  Administered Date(s) Administered   Tdap 06/30/2017    Diabetes He presents for his follow-up diabetic visit. He has type 2 diabetes mellitus. His disease course has been improving. There are no hypoglycemic associated symptoms. Pertinent negatives for hypoglycemia include no confusion, headaches, pallor or seizures. Associated symptoms include blurred vision. Pertinent negatives for diabetes include no chest pain, no fatigue, no polydipsia, no polyphagia, no polyuria and no weakness. There are no hypoglycemic complications. Symptoms are improving. Diabetic complications include heart disease. Risk factors for coronary artery disease include diabetes mellitus, dyslipidemia, hypertension, male sex and tobacco exposure. When asked about current treatments, none were reported. His weight is increasing steadily. He is following a generally unhealthy diet. When asked about meal planning, he reported none. He has not had a previous visit with a dietitian. He rarely participates in exercise. His home blood glucose trend is decreasing steadily. His breakfast blood glucose range is generally 130-140 mg/dl. His dinner blood glucose range is generally 130-140 mg/dl. His overall  blood glucose range is 130-140 mg/dl. (Patient presents with significant improvement in his glycemic profile averaging 135 mg per DL, and point-of-care J8r of 7.1% improving from 11.1%.  He did not document any hypoglycemia.  His insurance did not provide coverage for CGM device.     ) An ACE inhibitor/angiotensin II receptor blocker is not being taken. Eye exam is not current.  Hyperlipidemia This is a chronic problem. The problem is uncontrolled. Exacerbating diseases include diabetes. Pertinent negatives include no chest pain,  myalgias or shortness of breath. Current antihyperlipidemic treatment includes statins and ezetimibe . Risk factors for coronary artery disease include dyslipidemia, diabetes mellitus, male sex, hypertension and a sedentary lifestyle.  Hypertension This is a chronic problem. The problem is controlled. Associated symptoms include blurred vision. Pertinent negatives include no chest pain, headaches, neck pain, palpitations or shortness of breath. Risk factors for coronary artery disease include dyslipidemia, diabetes mellitus, male gender, sedentary lifestyle and smoking/tobacco exposure. Past treatments include beta blockers. Hypertensive end-organ damage includes CAD/MI.      Objective:       02/03/2024    8:34 AM 11/10/2023   10:09 AM 10/27/2023    9:04 AM  Vitals with BMI  Height 5' 9 5' 9 5' 9  Weight 203 lbs 6 oz 193 lbs 10 oz 196 lbs 3 oz  BMI 30.02 28.58 28.96  Systolic 112 106 885  Diastolic 76 72 66  Pulse 60 68 62    BP 112/76   Pulse 60   Ht 5' 9 (1.753 m)   Wt 203 lb 6.4 oz (92.3 kg)   BMI 30.04 kg/m   Wt Readings from Last 3 Encounters:  02/03/24 203 lb 6.4 oz (92.3 kg)  11/10/23 193 lb 9.6 oz (87.8 kg)  10/27/23 196 lb 3.2 oz (89 kg)      CMP ( most recent) CMP     Component Value Date/Time   NA 139 10/31/2023 0958   K 4.0 10/31/2023 0958   CL 103 10/31/2023 0958   CO2 24 10/31/2023 0958   GLUCOSE 261 (H) 10/31/2023 0958    GLUCOSE 174 (H) 08/03/2015 1533   BUN 14 10/31/2023 0958   CREATININE 0.74 (L) 10/31/2023 0958   CALCIUM  8.9 10/31/2023 0958   PROT 6.7 10/31/2023 0958   ALBUMIN 4.2 10/31/2023 0958   AST 17 10/31/2023 0958   ALT 21 10/31/2023 0958   ALKPHOS 88 10/31/2023 0958   BILITOT 0.7 10/31/2023 0958   EGFR 104 10/31/2023 0958   GFRNONAA 99 07/14/2017 0823     Diabetic Labs (most recent): Lab Results  Component Value Date   HGBA1C 7.1 (A) 02/03/2024   HGBA1C 11.1 (A) 10/27/2023   HGBA1C 11.7 (H) 05/20/2022     Lipid Panel ( most recent) Lipid Panel     Component Value Date/Time   CHOL 140 10/31/2023 0958   TRIG 167 (H) 10/31/2023 0958   HDL 39 (L) 10/31/2023 0958   CHOLHDL 3.6 10/31/2023 0958   CHOLHDL 6.1 01/09/2010 0156   VLDL 39 01/09/2010 0156   LDLCALC 72 10/31/2023 0958   LDLDIRECT 66.7 07/29/2009 0828   LABVLDL 29 10/31/2023 0958        Assessment & Plan:   1. DM type 2 causing vascular disease (HCC) (Primary)   - Dennis Terry has currently uncontrolled symptomatic type 2 DM since 59 years of age.  Patient presents with significant improvement in his glycemic profile averaging 135 mg per DL, and point-of-care J8r of 7.1% improving from 11.1%.  He did not document any hypoglycemia.  His insurance did not provide coverage for CGM device.    - Recent labs reviewed. - I had a long discussion with him about the possible risk factors and  the pathology behind diabetes and its complications. -his diabetes is complicated by coronary artery disease, sedentary life, comorbid hypertension/hyperlipidemia and he remains at exceedingly  high risk for more acute and chronic complications which include CAD, CVA, CKD, retinopathy, and neuropathy. These are all discussed in detail with  him.  - I discussed all available options of managing his diabetes including de-escalation of medications. I have counseled him on Food as Medicine by adopting a Whole Food , Plant Predominant  ( WFPP)  nutrition as recommended by [employer] of Lifestyle Medicine. Patient is encouraged to switch to  unprocessed or minimally processed  complex starch, adequate protein intake (mainly plant source), minimal liquid fat, plenty of fruits, and vegetables. -  he is advised to stick to a routine mealtimes to eat 3 complete meals a day and snack only when necessary (to snack only to correct hypoglycemia BG <70 day time or <100 at night).   - he acknowledges that there is a room for improvement in his food and drink choices. - Further Specific Suggestion is made for him to avoid simple carbohydrates  from his diet including Cakes, Sweet Desserts, Ice Cream, Soda (diet and regular), Sweet Tea, Candies, Chips, Cookies, Store Bought Juices, Alcohol ,  Artificial Sweeteners,  Coffee Creamer, and Sugar-free Products. This will help patient to have more stable blood glucose profile and potentially avoid unintended weight gain.   - he will be scheduled with Penny Crumpton, RDN, CDE for individualized diabetes education.  - I have approached him with the following individualized plan to manage  his diabetes and patient agrees:   - He has responded to his dietary changes and oral medications.  He will not need insulin treatment at this time.    -He generally has huge dislike for medications.  He is willing to stay on his metformin  and Jardiance .  I advised him to continue Jardiance  25 mg p.o. daily at breakfast, metformin  500 mg p.o. twice daily.  -He is encouraged to monitor blood glucose twice a day-daily before breakfast and at bedtime. - he is encouraged to call clinic for blood glucose levels less than 70 or above 200 mg /dl.  - Specific targets for  A1c;  LDL, HDL,  and Triglycerides were discussed with the patient.  2) Blood Pressure /Hypertension:    His blood pressure is controlled to target.  he is advised to continue his current medications including metoprolol  25 mg p.o. twice daily, he will  be considered for ACE inhibitors or ARB next visit.  3) Lipids/Hyperlipidemia:   Review of his recent lipid panel showed controlled LDL at 72.  He is advised to continue simvastatin  40 mg p.o. daily, and ezetimibe  10 mg p.o. daily at bedtime.   Side effects and precautions discussed with him.  4)  Weight/Diet:  Body mass index is 30.04 kg/m.  -     he is  a candidate for some weight loss. I discussed with him the fact that loss of 5 - 10% of his  current body weight will have the most impact on his diabetes management.  The above detailed  ACLM recommendations for nutrition, exercise, sleep, social life, avoidance of risky substances, the need for restorative sleep  information is also detailed on discharge instructions.  5) Chronic Care/Health Maintenance:  -he  is on Statin medications and  is encouraged to initiate and continue to follow up with Ophthalmology, Dentist,  Podiatrist at least yearly or according to recommendations, and advised to   stay away from smoking. I have recommended yearly flu vaccine and pneumonia vaccine at least every 5 years; moderate intensity exercise for up to 150 minutes weekly; and  sleep for 7- 9 hours a day.  - he is  advised to maintain close follow up with Joesph,  Annabella HERO, FNP for primary care needs, as well as his other providers for optimal and coordinated care.   I spent  26  minutes in the care of the patient today including review of labs from CMP, Lipids, Thyroid  Function, Hematology (current and previous including abstractions from other facilities); face-to-face time discussing  his blood glucose readings/logs, discussing hypoglycemia and hyperglycemia episodes and symptoms, medications doses, his options of short and long term treatment based on the latest standards of care / guidelines;  discussion about incorporating lifestyle medicine;  and documenting the encounter. Risk reduction counseling performed per USPSTF guidelines to reduce  obesity and  cardiovascular risk factors.     Please refer to Patient Instructions for Blood Glucose Monitoring and Insulin/Medications Dosing Guide  in media tab for additional information. Please  also refer to  Patient Self Inventory in the Media  tab for reviewed elements of pertinent patient history.  Debby Sharps participated in the discussions, expressed understanding, and voiced agreement with the above plans.  All questions were answered to his satisfaction. he is encouraged to contact clinic should he have any questions or concerns prior to his return visit.  Dear Patient: Feel free to review your progress notes.  If you are reviewing this progress note and have questions about the meaning of /or medical terms being used, please make a note and address it at your next follow-up appointment.  Medical notes are meant to be a communication tool between medical professionals and require medical terms to be used for efficiency and insurance approval.    Follow up plan: - Return in about 4 months (around 06/02/2024) for Bring Meter/CGM Device/Logs- A1c in Office.  Dennis Earl, MD [facility] [address] [place], [place] [address] Phone: [phone]  Fax: [phone]    02/03/2024, 2:04 PM  This note was partially dictated with voice recognition software. Similar sounding words can be transcribed inadequately or may not  be corrected upon review.  "

## 2024-02-03 NOTE — Patient Instructions (Signed)

## 2024-03-01 ENCOUNTER — Ambulatory Visit: Admitting: Cardiology

## 2024-06-08 ENCOUNTER — Ambulatory Visit: Admitting: "Endocrinology
